# Patient Record
Sex: Female | Born: 1955 | Race: White | Hispanic: No | Marital: Single | Smoking: Never smoker
Health system: Southern US, Community
[De-identification: ages and names within clinical notes are randomized; demographics above are authoritative.]

## PROBLEM LIST (undated history)

## (undated) DIAGNOSIS — K589 Irritable bowel syndrome without diarrhea: Secondary | ICD-10-CM

## (undated) DIAGNOSIS — H3530 Unspecified macular degeneration: Secondary | ICD-10-CM

## (undated) DIAGNOSIS — Z9109 Other allergy status, other than to drugs and biological substances: Secondary | ICD-10-CM

## (undated) DIAGNOSIS — M1990 Unspecified osteoarthritis, unspecified site: Secondary | ICD-10-CM

## (undated) DIAGNOSIS — H409 Unspecified glaucoma: Secondary | ICD-10-CM

## (undated) DIAGNOSIS — G4700 Insomnia, unspecified: Secondary | ICD-10-CM

## (undated) DIAGNOSIS — E785 Hyperlipidemia, unspecified: Secondary | ICD-10-CM

## (undated) DIAGNOSIS — L309 Dermatitis, unspecified: Secondary | ICD-10-CM

## (undated) DIAGNOSIS — F32A Depression, unspecified: Secondary | ICD-10-CM

## (undated) DIAGNOSIS — Z8619 Personal history of other infectious and parasitic diseases: Secondary | ICD-10-CM

## (undated) DIAGNOSIS — E663 Overweight: Secondary | ICD-10-CM

## (undated) HISTORY — DX: Unspecified macular degeneration: H35.30

## (undated) HISTORY — DX: Unspecified osteoarthritis, unspecified site: M19.90

## (undated) HISTORY — DX: Other allergy status, other than to drugs and biological substances: Z91.09

## (undated) HISTORY — DX: Dermatitis, unspecified: L30.9

## (undated) HISTORY — DX: Overweight: E66.3

## (undated) HISTORY — DX: Unspecified glaucoma: H40.9

## (undated) HISTORY — DX: Personal history of other infectious and parasitic diseases: Z86.19

## (undated) HISTORY — DX: Hyperlipidemia, unspecified: E78.5

## (undated) HISTORY — PX: INCISION AND DRAINAGE BREAST ABSCESS: SUR672

## (undated) HISTORY — DX: Insomnia, unspecified: G47.00

## (undated) HISTORY — DX: Depression, unspecified: F32.A

## (undated) HISTORY — PX: DILATION AND CURETTAGE OF UTERUS: SHX78

## (undated) HISTORY — DX: Irritable bowel syndrome, unspecified: K58.9

## (undated) HISTORY — PX: SKIN BIOPSY: SHX1

---

## 1987-09-22 HISTORY — PX: BREAST EXCISIONAL BIOPSY: SUR124

## 2009-09-09 LAB — HM COLONOSCOPY

## 2020-08-12 LAB — HM MAMMOGRAPHY

## 2021-11-04 ENCOUNTER — Encounter: Payer: Self-pay | Admitting: Internal Medicine

## 2021-11-04 ENCOUNTER — Ambulatory Visit
Admission: RE | Admit: 2021-11-04 | Discharge: 2021-11-04 | Disposition: A | Discharge status: Home / Self Care | Payer: Medicare Other | Attending: Internal Medicine | Admitting: Internal Medicine

## 2021-11-04 ENCOUNTER — Other Ambulatory Visit: Payer: Self-pay

## 2021-11-04 ENCOUNTER — Other Ambulatory Visit: Payer: Self-pay | Admitting: Gastroenterology

## 2021-11-04 ENCOUNTER — Ambulatory Visit (INDEPENDENT_AMBULATORY_CARE_PROVIDER_SITE_OTHER): Payer: Medicare Other | Admitting: Internal Medicine

## 2021-11-04 ENCOUNTER — Ambulatory Visit
Admission: RE | Admit: 2021-11-04 | Discharge: 2021-11-04 | Disposition: A | Discharge status: Home / Self Care | Payer: Medicare Other | Source: Ambulatory Visit | Attending: Internal Medicine | Admitting: Internal Medicine

## 2021-11-04 VITALS — BP 120/78 | HR 76 | Temp 98.1°F | Resp 16 | Ht 60.25 in | Wt 142.6 lb

## 2021-11-04 DIAGNOSIS — E782 Mixed hyperlipidemia: Secondary | ICD-10-CM

## 2021-11-04 DIAGNOSIS — E2839 Other primary ovarian failure: Secondary | ICD-10-CM

## 2021-11-04 DIAGNOSIS — H353130 Nonexudative age-related macular degeneration, bilateral, stage unspecified: Secondary | ICD-10-CM

## 2021-11-04 DIAGNOSIS — M79672 Pain in left foot: Secondary | ICD-10-CM

## 2021-11-04 DIAGNOSIS — F3340 Major depressive disorder, recurrent, in remission, unspecified: Secondary | ICD-10-CM

## 2021-11-04 DIAGNOSIS — Z8619 Personal history of other infectious and parasitic diseases: Secondary | ICD-10-CM | POA: Insufficient documentation

## 2021-11-04 DIAGNOSIS — L309 Dermatitis, unspecified: Secondary | ICD-10-CM | POA: Insufficient documentation

## 2021-11-04 DIAGNOSIS — F329 Major depressive disorder, single episode, unspecified: Secondary | ICD-10-CM | POA: Insufficient documentation

## 2021-11-04 DIAGNOSIS — Z1211 Encounter for screening for malignant neoplasm of colon: Secondary | ICD-10-CM

## 2021-11-04 DIAGNOSIS — Z13820 Encounter for screening for osteoporosis: Secondary | ICD-10-CM

## 2021-11-04 DIAGNOSIS — H409 Unspecified glaucoma: Secondary | ICD-10-CM | POA: Insufficient documentation

## 2021-11-04 DIAGNOSIS — Z9109 Other allergy status, other than to drugs and biological substances: Secondary | ICD-10-CM

## 2021-11-04 DIAGNOSIS — H3530 Unspecified macular degeneration: Secondary | ICD-10-CM | POA: Insufficient documentation

## 2021-11-04 DIAGNOSIS — Z1231 Encounter for screening mammogram for malignant neoplasm of breast: Secondary | ICD-10-CM

## 2021-11-04 DIAGNOSIS — Z789 Other specified health status: Secondary | ICD-10-CM | POA: Diagnosis not present

## 2021-11-04 DIAGNOSIS — E781 Pure hyperglyceridemia: Secondary | ICD-10-CM

## 2021-11-04 DIAGNOSIS — M1990 Unspecified osteoarthritis, unspecified site: Secondary | ICD-10-CM | POA: Insufficient documentation

## 2021-11-04 DIAGNOSIS — G4700 Insomnia, unspecified: Secondary | ICD-10-CM

## 2021-11-04 DIAGNOSIS — E785 Hyperlipidemia, unspecified: Secondary | ICD-10-CM | POA: Insufficient documentation

## 2021-11-04 DIAGNOSIS — Z1159 Encounter for screening for other viral diseases: Secondary | ICD-10-CM

## 2021-11-04 DIAGNOSIS — Z800 Family history of malignant neoplasm of digestive organs: Secondary | ICD-10-CM

## 2021-11-04 MED ORDER — SUTAB 1479-225-188 MG PO TABS
12.0000 | ORAL_TABLET | Freq: Once | ORAL | 0 refills | Status: DC
Start: 2021-11-04 — End: 2021-11-04

## 2021-11-04 NOTE — Assessment & Plan Note (Signed)
History of statin intolerance - cause rhabdo in the past. Currently on fish oil, will obtain labs today.

## 2021-11-04 NOTE — Progress Notes (Signed)
New Patient Office Visit  Subjective:  Patient ID: Melissa Malone, female    DOB: 12/09/1955  Age: 66 y.o. MRN: [id number]  CC:  Chief Complaint  Patient presents with   Establish Care   Foot Pain    On top of left foot   skin changes    New skin spots    HPI Melissa Malone presents as a new patient. She recently moved here from [place] to be closer to family. She does have a complaint today - left foot pain.   FOOT PAIN Duration:  1.5 years Involved foot: left Mechanism of injury: unknown Location: top of foot Onset: gradual  Quality:  sharp and pressure-like Frequency: occasional Radiation: no Aggravating factors: walking on pavement, prolonged standing Alleviating factors: nothing  Status: fluctuating Weakness with weight bearing or walking: no Noticed that big toe is pushing into second digit, making her pain worse.  HLD: -Medications: Fish oil, statin intolerance -history of rhabdomyolysis  -Patient is compliant with above medications and reports no side effects.  -Last lipid panel: in 9/21 will repeat today  Macular Degeneration both wet and dry: -Right every 6 weeks and left every 10 weeks -Following with Ophthalmology at [facility]   MDD: -Mood status: stable -Current treatment: Lexapro 5 for the last 2-3 weeks, trying to wean off  -Satisfied with current treatment?: yes -Duration of current treatment : chronic -Side effects: no Medication compliance:  trying to taper down   Depression screen [facility] 2/9 11/04/2021  Decreased Interest 0  Down, Depressed, Hopeless 1  PHQ - 2 Score 1  Altered sleeping 2  Tired, decreased energy 1  Change in appetite 2  Feeling bad or failure about yourself  0  Trouble concentrating 0  Moving slowly or fidgety/restless 0  Suicidal thoughts 0  PHQ-9 Score 6  Difficult doing work/chores Somewhat difficult   Seasonal Allergies: -Currently on Zyrtec   Health Maintenance: -Blood work due  -Mammogram/DEXA  due -Colon ca screening due   Past Medical History:  Diagnosis Date   Arthritis    Depression    Eczema    Environmental allergies    Glaucoma    H/O cold sores    Hyperlipidemia    IBS (irritable bowel syndrome)    Insomnia    Macular degeneration    Over weight     Family History  Problem Relation Age of Onset   Arthritis Mother    Diabetes Mother    Heart disease Mother    COPD Mother    Dementia Mother    Macular degeneration Mother    Macular degeneration Father    Arthritis Father    Heart disease Father    Cancer Father        lung   COPD Father    Macular degeneration Sister    Diabetes Sister    Obesity Sister    Irregular heart beat Sister    Irregular heart beat Brother    Cancer Brother        COLON   Obesity Brother    Depression Daughter    Obesity Daughter     Social History   Socioeconomic History   Marital status: Single    Spouse name: Not on file   Number of children: Not on file   Years of education: Not on file   Highest education level: Not on file  Occupational History   Not on file  Tobacco Use   Smoking status: Never   Smokeless tobacco:  Never  Vaping Use   Vaping Use: Never used  Substance and Sexual Activity   Alcohol use: Yes    Comment: 1-2 PER MONTH   Drug use: Never   Sexual activity: Not Currently  Other Topics Concern   Not on file  Social History Narrative   Not on file   Social Determinants of Health   Financial Resource Strain: Not on file  Food Insecurity: Not on file  Transportation Needs: Not on file  Physical Activity: Not on file  Stress: Not on file  Social Connections: Not on file  Intimate Partner Violence: Not on file    ROS Review of Systems  Constitutional:  Negative for chills and fever.  Eyes:  Positive for visual disturbance.  Respiratory:  Negative for cough and shortness of breath.   Cardiovascular:  Negative for chest pain.  Gastrointestinal:  Negative for abdominal pain.   Neurological:  Negative for dizziness and headaches.   Objective:   Today's Vitals: BP 120/78    Pulse 76    Temp 98.1 F (36.7 C)    Resp 16    Ht 4' 2.25" (1.276 m)    Wt 142 lb 9.6 oz (64.7 kg)    SpO2 99%    BMI 39.71 kg/m   Physical Exam Constitutional:      Appearance: Normal appearance.  HENT:     Head: Normocephalic and atraumatic.     Mouth/Throat:     Mouth: Mucous membranes are moist.     Pharynx: Oropharynx is clear.  Eyes:     Conjunctiva/sclera: Conjunctivae normal.  Cardiovascular:     Rate and Rhythm: Normal rate and regular rhythm.  Pulmonary:     Effort: Pulmonary effort is normal.     Breath sounds: Normal breath sounds.  Musculoskeletal:        General: Tenderness present. No swelling. Normal range of motion.     Right lower leg: No edema.     Left lower leg: No edema.     Comments: First toe starting to push towards second digit, no other obvious deformities   Skin:    General: Skin is warm and dry.  Neurological:     General: No focal deficit present.     Mental Status: She is alert. Mental status is at baseline.  Psychiatric:        Mood and Affect: Mood normal.        Behavior: Behavior normal.    Assessment & Plan:   Problem List Items Addressed This Visit       Other   Hyperlipemia    History of statin intolerance - cause rhabdo in the past. Currently on fish oil, will obtain labs today.      Relevant Orders   Lipid Profile   Macular degeneration    Following with ophthalmology for injections.      Relevant Orders   CBC w/Diff/Platelet   COMPLETE METABOLIC PANEL WITH GFR   Environmental allergies    Stable, on Zrytec.      MDD (major depressive disorder)    Symptoms well controlled now that her sleeping is better and she is on a better schedule. Had been on Lexapro for years, currently on 5 mg and trying to taper down. Will discontinue.       Relevant Medications   escitalopram (LEXAPRO) 10 MG tablet   Insomnia    Well  controlled currently with Melatonin and Benadryl occasionally. Discussed avoiding Benadryl as she gets older, patient agrees.  Statin intolerance    Hx of rhabdomyolysis.       Other Visit Diagnoses     Left foot pain    -  Primary - x-ray today to rule out stress fracture, referral to Podiatry for splinting.    Relevant Orders   DG Foot Complete Left   Ambulatory referral to Podiatry   Screening for colon cancer       Relevant Orders   Ambulatory referral to Gastroenterology   Encounter for screening mammogram for malignant neoplasm of breast       Relevant Orders   MM 3D SCREEN BREAST BILATERAL   Screening for osteoporosis       Relevant Orders   DG Bone Density   Estrogen deficiency       Relevant Orders   DG Bone Density   Encounter for hepatitis C screening test for low risk patient       Relevant Orders   Hepatitis C Antibody       Outpatient Encounter Medications as of 11/04/2021  Medication Sig   acyclovir ointment (ZOVIRAX) 5 % Apply 1 application topically as needed.   CALCIUM 600 1500 (600 Ca) MG TABS tablet    cetirizine (ZYRTEC) 10 MG tablet    escitalopram (LEXAPRO) 10 MG tablet Take 5 mg by mouth daily. Whinning down   latanoprost (XALATAN) 0.005 % ophthalmic solution    magnesium 30 MG tablet Take 30 mg by mouth 2 (two) times daily.   Melatonin 3 MG CAPS as needed.   Multiple Vitamin (MULTIVITAMIN) tablet Take 1 tablet by mouth daily.   Omega-3 Fatty Acids (FISH OIL) 1000 MG CAPS Take by mouth.   pimecrolimus (ELIDEL) 1 % cream Apply topically as needed.   Probiotic Product (PROBIOTIC-10 PO) Take by mouth every other day.   No facility-administered encounter medications on file as of 11/04/2021.    Follow-up: Return for CPE at her convenience.   Teodora Medici, DO

## 2021-11-04 NOTE — Assessment & Plan Note (Signed)
Well controlled currently with Melatonin and Benadryl occasionally. Discussed avoiding Benadryl as she gets older, patient agrees.

## 2021-11-04 NOTE — Assessment & Plan Note (Signed)
Symptoms well controlled now that her sleeping is better and she is on a better schedule. Had been on Lexapro for years, currently on 5 mg and trying to taper down. Will discontinue.

## 2021-11-04 NOTE — Assessment & Plan Note (Signed)
Following with ophthalmology for injections.

## 2021-11-04 NOTE — Assessment & Plan Note (Signed)
Hx of rhabdomyolysis.

## 2021-11-04 NOTE — Patient Instructions (Addendum)
It was great seeing you today!  Plan discussed at today's visit: -Blood work ordered today, results will be uploaded to [place].  -Mammogram and DEXA scan ordered -Referral to GI for colon cancer screening  -Referral to Podiatry as well  -X-ray ordered today   Follow up in: for CPE  Take care and let us know if you have any questions or concerns prior to your next visit.  Dr. Rosana Berger

## 2021-11-04 NOTE — Progress Notes (Signed)
Gastroenterology Pre-Procedure Review  Request Date: 12/19/2021 Requesting Physician: Dr. Marius Ditch  PATIENT REVIEW QUESTIONS: The patient responded to the following health history questions as indicated:    1. Are you having any GI issues? no 2. Do you have a personal history of Polyps? no 3. Do you have a family history of Colon Cancer or Polyps? yes (Parents- polyps; brother- colon cancer) 4. Diabetes Mellitus? no 5. Joint replacements in the past 12 months?no 6. Major health problems in the past 3 months?yes (macular degeneration of eyes) 7. Any artificial heart valves, MVP, or defibrillator?no    MEDICATIONS & ALLERGIES:    Patient reports the following regarding taking any anticoagulation/antiplatelet therapy:   Plavix, Coumadin, Eliquis, Xarelto, Lovenox, Pradaxa, Brilinta, or Effient? no Aspirin? no  Patient confirms/reports the following medications:  Current Outpatient Medications  Medication Sig Dispense Refill   acyclovir ointment (ZOVIRAX) 5 % Apply 1 application topically as needed.     CALCIUM 600 1500 (600 Ca) MG TABS tablet      cetirizine (ZYRTEC) 10 MG tablet      escitalopram (LEXAPRO) 10 MG tablet Take 5 mg by mouth daily. Whinning down     latanoprost (XALATAN) 0.005 % ophthalmic solution      magnesium 30 MG tablet Take 30 mg by mouth 2 (two) times daily.     Melatonin 3 MG CAPS as needed.     Multiple Vitamin (MULTIVITAMIN) tablet Take 1 tablet by mouth daily.     Omega-3 Fatty Acids (FISH OIL) 1000 MG CAPS Take by mouth.     pimecrolimus (ELIDEL) 1 % cream Apply topically as needed.     Probiotic Product (PROBIOTIC-10 PO) Take by mouth every other day.     No current facility-administered medications for this visit.    Patient confirms/reports the following allergies:  Allergies  Allergen Reactions   Bee Pollen    Codeine    Grass Pollen(K-O-R-T-Swt Vern)    Molds & Smuts    Nickel    Proparacaine    Statins     Rhabomyolitis    Sulfa Antibiotics      No orders of the defined types were placed in this encounter.   AUTHORIZATION INFORMATION Primary Insurance: 1D#: Group #:  Secondary Insurance: 1D#: Group #:  SCHEDULE INFORMATION: Date: 12/19/2021 Time: Location: [place]

## 2021-11-04 NOTE — Assessment & Plan Note (Signed)
Stable, on Zrytec.

## 2021-11-05 LAB — COMPLETE METABOLIC PANEL WITH GFR
AG Ratio: 1.7 (calc) (ref 1.0–2.5)
ALT: 38 U/L — ABNORMAL HIGH (ref 6–29)
AST: 27 U/L (ref 10–35)
Albumin: 4.5 g/dL (ref 3.6–5.1)
Alkaline phosphatase (APISO): 92 U/L (ref 37–153)
BUN: 15 mg/dL (ref 7–25)
CO2: 28 mmol/L (ref 20–32)
Calcium: 9.6 mg/dL (ref 8.6–10.4)
Chloride: 104 mmol/L (ref 98–110)
Creat: 0.66 mg/dL (ref 0.50–1.05)
Globulin: 2.7 g/dL (calc) (ref 1.9–3.7)
Glucose, Bld: 102 mg/dL — ABNORMAL HIGH (ref 65–99)
Potassium: 4.5 mmol/L (ref 3.5–5.3)
Sodium: 139 mmol/L (ref 135–146)
Total Bilirubin: 0.6 mg/dL (ref 0.2–1.2)
Total Protein: 7.2 g/dL (ref 6.1–8.1)
eGFR: 97 mL/min/{1.73_m2} (ref >=60)

## 2021-11-05 LAB — CBC WITH DIFFERENTIAL/PLATELET
Absolute Monocytes: 680 cells/uL (ref 200–950)
Basophils Absolute: 59 cells/uL (ref 0–200)
Basophils Relative: 0.9 %
Eosinophils Absolute: 251 cells/uL (ref 15–500)
Eosinophils Relative: 3.8 %
HCT: 39.7 % (ref 35.0–45.0)
Hemoglobin: 13.2 g/dL (ref 11.7–15.5)
Lymphs Abs: 3366 cells/uL (ref 850–3900)
MCH: 29.8 pg (ref 27.0–33.0)
MCHC: 33.2 g/dL (ref 32.0–36.0)
MCV: 89.6 fL (ref 80.0–100.0)
MPV: 10.5 fL (ref 7.5–12.5)
Monocytes Relative: 10.3 %
Neutro Abs: 2244 cells/uL (ref 1500–7800)
Neutrophils Relative %: 34 %
Platelets: 295 10*3/uL (ref 140–400)
RBC: 4.43 10*6/uL (ref 3.80–5.10)
RDW: 11.7 % (ref 11.0–15.0)
Total Lymphocyte: 51 %
WBC: 6.6 10*3/uL (ref 3.8–10.8)

## 2021-11-05 LAB — LIPID PANEL
Cholesterol: 279 mg/dL — ABNORMAL HIGH (ref <200)
HDL: 50 mg/dL (ref >=50)
LDL Cholesterol (Calc): 177 mg/dL (calc) — ABNORMAL HIGH
Non-HDL Cholesterol (Calc): 229 mg/dL (calc) — ABNORMAL HIGH (ref <130)
Total CHOL/HDL Ratio: 5.6 (calc) — ABNORMAL HIGH (ref <5.0)
Triglycerides: 289 mg/dL — ABNORMAL HIGH (ref <150)

## 2021-11-05 LAB — HEPATITIS C ANTIBODY
Hepatitis C Ab: NONREACTIVE
SIGNAL TO CUT-OFF: 0.07 (ref <1.00)

## 2021-12-12 ENCOUNTER — Ambulatory Visit (INDEPENDENT_AMBULATORY_CARE_PROVIDER_SITE_OTHER): Payer: Medicare Other | Admitting: Internal Medicine

## 2021-12-12 ENCOUNTER — Telehealth: Payer: Self-pay | Admitting: Gastroenterology

## 2021-12-12 ENCOUNTER — Encounter: Payer: Self-pay | Admitting: Internal Medicine

## 2021-12-12 VITALS — BP 116/60 | HR 82 | Temp 98.1°F | Resp 16 | Ht 60.25 in | Wt 147.0 lb

## 2021-12-12 DIAGNOSIS — E782 Mixed hyperlipidemia: Secondary | ICD-10-CM

## 2021-12-12 DIAGNOSIS — Z0000 Encounter for general adult medical examination without abnormal findings: Secondary | ICD-10-CM | POA: Diagnosis not present

## 2021-12-12 MED ORDER — ICOSAPENT ETHYL 1 G PO CAPS
2.0000 g | ORAL_CAPSULE | Freq: Two times a day (BID) | ORAL | 3 refills | Status: DC
Start: 2021-12-12 — End: 2023-01-15

## 2021-12-12 NOTE — Progress Notes (Signed)
Name: Melissa Malone   MRN: [id number]    DOB: May 22, 1956   Date:12/12/2021 ? ?     Progress Note ? ?Subjective ? ?Chief Complaint ? ?Chief Complaint  ?Patient presents with  ? Annual Exam  ? ? ?HPI ? ?Patient presents for annual CPE. ? ?Diet: eats healthy meals, eats a lot of protein and veggies but then snacks on sweets, does use butter as well.  ?Exercise: hikes once a week, walks twice a week at least, very active in general in the yard  ? ?The 10-year ASCVD risk score (Arnett DK, et al., 2019) is: 6.2% ?  Values used to calculate the score: ?    Age: 66 years ?    Sex: Female ?    Is Non-Hispanic African American: No ?    Diabetic: No ?    Tobacco smoker: No ?    Systolic Blood Pressure: 734 mmHg ?    Is BP treated: No ?    HDL Cholesterol: 50 mg/dL ?    Total Cholesterol: 279 mg/dL ? ? ? ?Depression: Phq 9 is  negative ? ?  12/12/2021  ?  2:03 PM 11/04/2021  ?  9:37 AM  ?Depression screen PHQ 2/9  ?Decreased Interest 0 0  ?Down, Depressed, Hopeless 0 1  ?PHQ - 2 Score 0 1  ?Altered sleeping 0 2  ?Tired, decreased energy 0 1  ?Change in appetite 0 2  ?Feeling bad or failure about yourself  0 0  ?Trouble concentrating 0 0  ?Moving slowly or fidgety/restless 0 0  ?Suicidal thoughts 0 0  ?PHQ-9 Score 0 6  ?Difficult doing work/chores Not difficult at all Somewhat difficult  ? ?Hypertension: ?BP Readings from Last 3 Encounters:  ?12/12/21 116/60  ?11/04/21 120/78  ? ?Obesity: ?Wt Readings from Last 3 Encounters:  ?12/12/21 147 lb (66.7 kg)  ?11/04/21 142 lb 9.6 oz (64.7 kg)  ? ?BMI Readings from Last 3 Encounters:  ?12/12/21 28.47 kg/m?  ?11/04/21 27.62 kg/m?  ?  ? ?Vaccines:  ? ?HPV: n/a ?Tdap: 2015 ?Shingrix: Had them in [place], will obtain records  ?Pneumonia: Had Pneumonia vaccine last year, not sure if 23 or 20  ?Flu: UTD ?COVID-19: UTD ? ? ?Hep C Screening: 2023 ?STD testing and prevention (HIV/chl/gon/syphilis): no concerns  ?Intimate partner violence: negative ?Menstrual History/LMP/Abnormal Bleeding:  ?Discussed  importance of follow up if any post-menopausal bleeding: yes ?Incontinence Symptoms: No. ? ?Breast cancer:  ?- Last Mammogram: ordered, not yet scheduled. Will call to schedule ?- BRCA gene screening: n/a ? ?Osteoporosis Prevention : Discussed high calcium and vitamin D supplementation, weight bearing exercises ?Bone density :ordered, not yet scheduled  ? ?Cervical cancer screening: last Pap 5 years ago, no abnormal Paps since age 66  ?Skin cancer: Discussed monitoring for atypical lesions  ?Colorectal cancer:   colonoscopy scheduled for next week Brother had colorectal cancer.  ?Lung cancer:  Low Dose CT Chest recommended if Age 4-80 years, 20 pack-year currently smoking OR have quit w/in 15years. Patient does not qualify.   ? ?Advanced Care Planning: A voluntary discussion about advance care planning including the explanation and discussion of advance directives.  Discussed health care proxy and Living will, and the patient was able to identify a health care proxy as daughter.  Patient does have a living will.  ? ?Lipids: ?Lab Results  ?Component Value Date  ? CHOL 279 (H) 11/04/2021  ? ?Lab Results  ?Component Value Date  ? HDL 50 11/04/2021  ? ?Lab Results  ?Component  Value Date  ? LDLCALC 177 (H) 11/04/2021  ? ?Lab Results  ?Component Value Date  ? TRIG 289 (H) 11/04/2021  ? ?Lab Results  ?Component Value Date  ? CHOLHDL 5.6 (H) 11/04/2021  ? ?No results found for: LDLDIRECT ? ?Glucose: ?Glucose, Bld  ?Date Value Ref Range Status  ?11/04/2021 102 (H) 65 - 99 mg/dL Final  ?  Comment:  ?  . ?           Fasting reference interval ?. ?For someone without known diabetes, a glucose value ?between 100 and 125 mg/dL is consistent with ?prediabetes and should be confirmed with a ?follow-up test. ?. ?  ? ? ?Patient Active Problem List  ? Diagnosis Date Noted  ? Hyperlipemia 11/04/2021  ? Macular degeneration 11/04/2021  ? Environmental allergies 11/04/2021  ? Arthritis 11/04/2021  ? Glaucoma 11/04/2021  ? MDD (major  depressive disorder) 11/04/2021  ? Eczema 11/04/2021  ? H/O cold sores 11/04/2021  ? Insomnia 11/04/2021  ? Statin intolerance 11/04/2021  ? ? ?Family History  ?Problem Relation Age of Onset  ? Arthritis Mother   ? Diabetes Mother   ? Heart disease Mother   ? COPD Mother   ? Dementia Mother   ? Macular degeneration Mother   ? Macular degeneration Father   ? Arthritis Father   ? Heart disease Father   ? Cancer Father   ?     lung  ? COPD Father   ? Macular degeneration Sister   ? Diabetes Sister   ? Obesity Sister   ? Irregular heart beat Sister   ? Irregular heart beat Brother   ? Cancer Brother   ?     COLON  ? Obesity Brother   ? Depression Daughter   ? Obesity Daughter   ? ? ?Social History  ? ?Socioeconomic History  ? Marital status: Single  ?  Spouse name: Not on file  ? Number of children: Not on file  ? Years of education: Not on file  ? Highest education level: Not on file  ?Occupational History  ? Not on file  ?Tobacco Use  ? Smoking status: Never  ? Smokeless tobacco: Never  ?Vaping Use  ? Vaping Use: Never used  ?Substance and Sexual Activity  ? Alcohol use: Yes  ?  Comment: 1-2 PER MONTH  ? Drug use: Never  ? Sexual activity: Not Currently  ?Other Topics Concern  ? Not on file  ?Social History Narrative  ? Not on file  ? ?Social Determinants of Health  ? ?Financial Resource Strain: Not on file  ?Food Insecurity: Not on file  ?Transportation Needs: Not on file  ?Physical Activity: Not on file  ?Stress: Not on file  ?Social Connections: Not on file  ?Intimate Partner Violence: Not on file  ? ? ? ?Current Outpatient Medications:  ?  acyclovir ointment (ZOVIRAX) 5 %, Apply 1 application topically as needed., Disp: , Rfl:  ?  CALCIUM 600 1500 (600 Ca) MG TABS tablet, , Disp: , Rfl:  ?  cetirizine (ZYRTEC) 10 MG tablet, , Disp: , Rfl:  ?  escitalopram (LEXAPRO) 10 MG tablet, Take 5 mg by mouth daily. Whinning down, Disp: , Rfl:  ?  latanoprost (XALATAN) 0.005 % ophthalmic solution, , Disp: , Rfl:  ?  magnesium  30 MG tablet, Take 30 mg by mouth 2 (two) times daily., Disp: , Rfl:  ?  Melatonin 3 MG CAPS, as needed., Disp: , Rfl:  ?  Multiple Vitamin (MULTIVITAMIN)  tablet, Take 1 tablet by mouth daily., Disp: , Rfl:  ?  Omega-3 Fatty Acids (FISH OIL) 1000 MG CAPS, Take by mouth., Disp: , Rfl:  ?  pimecrolimus (ELIDEL) 1 % cream, Apply topically as needed., Disp: , Rfl:  ?  Probiotic Product (PROBIOTIC-10 PO), Take by mouth every other day., Disp: , Rfl:  ? ?Allergies  ?Allergen Reactions  ? Bee Pollen   ? Codeine   ? Grass Pollen(K-O-R-T-Swt Vern)   ? Latex   ? Molds & Smuts   ? Nickel   ? Proparacaine   ? Statins   ?  Rhabomyolitis ?  ? Sulfa Antibiotics   ? ? ? ?Review of Systems  ?Constitutional: Negative.   ?Respiratory: Negative.    ?Cardiovascular: Negative.   ?Gastrointestinal: Negative.   ?Genitourinary: Negative.   ? ? ? ?Objective ? ?Vitals:  ? 12/12/21 1358  ?BP: 116/60  ?Pulse: 82  ?Resp: 16  ?Temp: 98.1 ?F (36.7 ?C)  ?SpO2: 98%  ?Weight: 147 lb (66.7 kg)  ?Height: 5' 0.25" (1.53 m)  ? ? ?Body mass index is 28.47 kg/m?. ? ?Physical Exam ?Constitutional:   ?   Appearance: Normal appearance.  ?HENT:  ?   Head: Normocephalic and atraumatic.  ?Eyes:  ?   Conjunctiva/sclera: Conjunctivae normal.  ?Cardiovascular:  ?   Rate and Rhythm: Normal rate and regular rhythm.  ?Pulmonary:  ?   Effort: Pulmonary effort is normal.  ?   Breath sounds: Normal breath sounds.  ?Musculoskeletal:  ?   Right lower leg: No edema.  ?   Left lower leg: No edema.  ?Skin: ?   General: Skin is warm and dry.  ?Neurological:  ?   General: No focal deficit present.  ?   Mental Status: She is alert. Mental status is at baseline.  ?Psychiatric:     ?   Mood and Affect: Mood normal.     ?   Behavior: Behavior normal.  ? ? ?Recent Results (from the past 2160 hour(s))  ?CBC w/Diff/Platelet     Status: None  ? Collection Time: 11/04/21 10:26 AM  ?Result Value Ref Range  ? WBC 6.6 3.8 - 10.8 Thousand/uL  ? RBC 4.43 3.80 - 5.10 Million/uL  ? Hemoglobin  13.2 11.7 - 15.5 g/dL  ? HCT 39.7 35.0 - 45.0 %  ? MCV 89.6 80.0 - 100.0 fL  ? MCH 29.8 27.0 - 33.0 pg  ? MCHC 33.2 32.0 - 36.0 g/dL  ? RDW 11.7 11.0 - 15.0 %  ? Platelets 295 140 - 400 Thousand/uL  ? MPV

## 2021-12-12 NOTE — Telephone Encounter (Signed)
PT has questions about colon  prep ?

## 2021-12-12 NOTE — Patient Instructions (Signed)
Health Maintenance, Female ?Adopting a healthy lifestyle and getting preventive care are important in promoting health and wellness. Ask your health care provider about: ?The right schedule for you to have regular tests and exams. ?Things you can do on your own to prevent diseases and keep yourself healthy. ?What should I know about diet, weight, and exercise? ?Eat a healthy diet ? ?Eat a diet that includes plenty of vegetables, fruits, low-fat dairy products, and lean protein. ?Do not eat a lot of foods that are high in solid fats, added sugars, or sodium. ?Maintain a healthy weight ?Body mass index (BMI) is used to identify weight problems. It estimates body fat based on height and weight. Your health care provider can help determine your BMI and help you achieve or maintain a healthy weight. ?Get regular exercise ?Get regular exercise. This is one of the most important things you can do for your health. Most adults should: ?Exercise for at least 150 minutes each week. The exercise should increase your heart rate and make you sweat (moderate-intensity exercise). ?Do strengthening exercises at least twice a week. This is in addition to the moderate-intensity exercise. ?Spend less time sitting. Even light physical activity can be beneficial. ?Watch cholesterol and blood lipids ?Have your blood tested for lipids and cholesterol at 66 years of age, then have this test every 5 years. ?Have your cholesterol levels checked more often if: ?Your lipid or cholesterol levels are high. ?You are older than 66 years of age. ?You are at high risk for heart disease. ?What should I know about cancer screening? ?Depending on your health history and family history, you may need to have cancer screening at various ages. This may include screening for: ?Breast cancer. ?Cervical cancer. ?Colorectal cancer. ?Skin cancer. ?Lung cancer. ?What should I know about heart disease, diabetes, and high blood pressure? ?Blood pressure and heart  disease ?High blood pressure causes heart disease and increases the risk of stroke. This is more likely to develop in people who have high blood pressure readings or are overweight. ?Have your blood pressure checked: ?Every 3-5 years if you are 19-36 years of age. ?Every year if you are 48 years old or older. ?Diabetes ?Have regular diabetes screenings. This checks your fasting blood sugar level. Have the screening done: ?Once every three years after age 18 if you are at a normal weight and have a low risk for diabetes. ?More often and at a younger age if you are overweight or have a high risk for diabetes. ?What should I know about preventing infection? ?Hepatitis B ?If you have a higher risk for hepatitis B, you should be screened for this virus. Talk with your health care provider to find out if you are at risk for hepatitis B infection. ?Hepatitis C ?Testing is recommended for: ?Everyone born from [birth year] through 1965. ?Anyone with known risk factors for hepatitis C. ?Sexually transmitted infections (STIs) ?Get screened for STIs, including gonorrhea and chlamydia, if: ?You are sexually active and are younger than 66 years of age. ?You are older than 66 years of age and your health care provider tells you that you are at risk for this type of infection. ?Your sexual activity has changed since you were last screened, and you are at increased risk for chlamydia or gonorrhea. Ask your health care provider if you are at risk. ?Ask your health care provider about whether you are at high risk for HIV. Your health care provider may recommend a prescription medicine to help prevent HIV  infection. If you choose to take medicine to prevent HIV, you should first get tested for HIV. You should then be tested every 3 months for as long as you are taking the medicine. ?Pregnancy ?If you are about to stop having your period (premenopausal) and you may become pregnant, seek counseling before you get pregnant. ?Take 400 to 800  micrograms (mcg) of folic acid every day if you become pregnant. ?Ask for birth control (contraception) if you want to prevent pregnancy. ?Osteoporosis and menopause ?Osteoporosis is a disease in which the bones lose minerals and strength with aging. This can result in bone fractures. If you are 70 years old or older, or if you are at risk for osteoporosis and fractures, ask your health care provider if you should: ?Be screened for bone loss. ?Take a calcium or vitamin D supplement to lower your risk of fractures. ?Be given hormone replacement therapy (HRT) to treat symptoms of menopause. ?Follow these instructions at home: ?Alcohol use ?Do not drink alcohol if: ?Your health care provider tells you not to drink. ?You are pregnant, may be pregnant, or are planning to become pregnant. ?If you drink alcohol: ?Limit how much you have to: ?0-1 drink a day. ?Know how much alcohol is in your drink. In the [place] one drink equals one 12 oz bottle of beer (355 mL), one 5 oz glass of wine (148 mL), or one 1? oz glass of hard liquor (44 mL). ?Lifestyle ?Do not use any products that contain nicotine or tobacco. These products include cigarettes, chewing tobacco, and vaping devices, such as e-cigarettes. If you need help quitting, ask your health care provider. ?Do not use street drugs. ?Do not share needles. ?Ask your health care provider for help if you need support or information about quitting drugs. ?General instructions ?Schedule regular health, dental, and eye exams. ?Stay current with your vaccines. ?Tell your health care provider if: ?You often feel depressed. ?You have ever been abused or do not feel safe at home. ?Summary ?Adopting a healthy lifestyle and getting preventive care are important in promoting health and wellness. ?Follow your health care provider's instructions about healthy diet, exercising, and getting tested or screened for diseases. ?Follow your health care provider's instructions on monitoring your  cholesterol and blood pressure. ?This information is not intended to replace advice given to you by your health care provider. Make sure you discuss any questions you have with your health care provider. ?Document Revised: 01/27/2021 Document Reviewed: 01/27/2021 ?Elsevier Patient Education ? 2022 [place]. ? ?   Why follow it? Research shows? ?Those who follow the Mediterranean diet have a reduced risk of heart disease  ?The diet is associated with a reduced incidence of Parkinson's and Alzheimer's diseases ?People following the diet may have longer life expectancies and lower rates of chronic diseases  ?The Dietary Guidelines for Americans recommends the Mediterranean diet as an eating plan to promote health and prevent disease ? ?What Is the Mediterranean Diet?  ?Healthy eating plan based on typical foods and recipes of Mediterranean-style cooking ?The diet is primarily a plant based diet; these foods should make up a majority of meals  ? ?Starches - Plant based foods should make up a majority of meals ?- They are an important sources of vitamins, minerals, energy, antioxidants, and fiber ?- Choose whole grains, foods high in fiber and minimally processed items  ?- Typical grain sources include wheat, oats, barley, corn, brown rice, bulgar, farro, millet, polenta, couscous  ?- Various types of beans  include chickpeas, lentils, fava beans, black beans, white beans   ?Fruits  Veggies - Large quantities of antioxidant rich fruits & veggies; 6 or more servings  ?- Vegetables can be eaten raw or lightly drizzled with oil and cooked  ?- Vegetables common to the traditional Mediterranean Diet include: artichokes, arugula, beets, broccoli, brussel sprouts, cabbage, carrots, celery, collard greens, cucumbers, eggplant, kale, leeks, lemons, lettuce, mushrooms, okra, onions, peas, peppers, potatoes, pumpkin, radishes, rutabaga, shallots, spinach, sweet potatoes, turnips, zucchini ?- Fruits common to the Mediterranean  Diet include: apples, apricots, avocados, cherries, clementines, dates, figs, grapefruits, grapes, melons, nectarines, oranges, peaches, pears, pomegranates, strawberries, tangerines  ?Fats - Replace butter and

## 2021-12-15 ENCOUNTER — Telehealth: Payer: Self-pay

## 2021-12-15 NOTE — Telephone Encounter (Signed)
Called patient back to answer any questions she may have about her prep no answer left voicemail for a call back ?

## 2021-12-17 ENCOUNTER — Telehealth: Payer: Self-pay

## 2021-12-17 NOTE — Telephone Encounter (Signed)
Patient called and wanted instructions on how to take prep I helped her understand how she is good now ?

## 2021-12-18 NOTE — Addendum Note (Signed)
Addended by: Teodora Medici on: 12/18/2021 02:26 PM ? ? Modules accepted: Level of Service ? ?

## 2021-12-19 ENCOUNTER — Encounter: Payer: Self-pay | Admitting: Gastroenterology

## 2021-12-19 ENCOUNTER — Other Ambulatory Visit: Payer: Self-pay

## 2021-12-19 ENCOUNTER — Ambulatory Visit: Payer: Medicare Other | Admitting: Anesthesiology

## 2021-12-19 ENCOUNTER — Ambulatory Visit
Admission: RE | Admit: 2021-12-19 | Discharge: 2021-12-19 | Disposition: A | Discharge status: Home / Self Care | Payer: Medicare Other | Attending: Gastroenterology | Admitting: Gastroenterology

## 2021-12-19 ENCOUNTER — Encounter
Admission: RE | Disposition: A | Discharge status: Home / Self Care | Payer: Self-pay | Source: Home / Self Care | Attending: Gastroenterology

## 2021-12-19 DIAGNOSIS — K589 Irritable bowel syndrome without diarrhea: Secondary | ICD-10-CM | POA: Insufficient documentation

## 2021-12-19 DIAGNOSIS — Z800 Family history of malignant neoplasm of digestive organs: Secondary | ICD-10-CM | POA: Diagnosis not present

## 2021-12-19 DIAGNOSIS — G4700 Insomnia, unspecified: Secondary | ICD-10-CM | POA: Diagnosis not present

## 2021-12-19 DIAGNOSIS — D123 Benign neoplasm of transverse colon: Secondary | ICD-10-CM | POA: Diagnosis not present

## 2021-12-19 DIAGNOSIS — Z1211 Encounter for screening for malignant neoplasm of colon: Secondary | ICD-10-CM | POA: Insufficient documentation

## 2021-12-19 DIAGNOSIS — K635 Polyp of colon: Secondary | ICD-10-CM

## 2021-12-19 DIAGNOSIS — F32A Depression, unspecified: Secondary | ICD-10-CM | POA: Diagnosis not present

## 2021-12-19 DIAGNOSIS — E785 Hyperlipidemia, unspecified: Secondary | ICD-10-CM | POA: Insufficient documentation

## 2021-12-19 HISTORY — PX: COLONOSCOPY WITH PROPOFOL: SHX5780

## 2021-12-19 SURGERY — COLONOSCOPY WITH PROPOFOL
Anesthesia: General

## 2021-12-19 MED ORDER — SODIUM CHLORIDE 0.9 % IV SOLN: INTRAVENOUS | Status: DC

## 2021-12-19 MED ORDER — PROPOFOL 10 MG/ML IV BOLUS
INTRAVENOUS | Status: DC | PRN
Start: 2021-12-19 — End: 2021-12-19
  Administered 2021-12-19: 20 mg via INTRAVENOUS
  Administered 2021-12-19: 10 mg via INTRAVENOUS
  Administered 2021-12-19: 30 mg via INTRAVENOUS
  Administered 2021-12-19: 70 mg via INTRAVENOUS

## 2021-12-19 MED ORDER — SODIUM CHLORIDE (PF) 0.9 % IJ SOLN
INTRAMUSCULAR | Status: DC | PRN
  Administered 2021-12-19: 5 mL

## 2021-12-19 MED ORDER — PROPOFOL 500 MG/50ML IV EMUL
INTRAVENOUS | Status: DC | PRN
  Administered 2021-12-19: 140 ug/kg/min via INTRAVENOUS

## 2021-12-19 NOTE — Anesthesia Preprocedure Evaluation (Addendum)
Anesthesia Evaluation  ?Patient identified by MRN, date of birth, ID band ?Patient awake ? ? ? ?Reviewed: ?Allergy & Precautions, NPO status , Patient's Chart, lab work & pertinent test results ? ?Airway ?Mallampati: II ? ?TM Distance: >3 FB ?Neck ROM: full ? ? ? Dental ? ?(+) Missing ?  ?Pulmonary ?neg pulmonary ROS,  ?  ?Pulmonary exam normal ? ? ? ? ? ? ? Cardiovascular ?negative cardio ROS ?Normal cardiovascular exam ? ? ?  ?Neuro/Psych ?PSYCHIATRIC DISORDERS Depression negative neurological ROS ?   ? GI/Hepatic ?negative GI ROS, Neg liver ROS,   ?Endo/Other  ?negative endocrine ROS ? Renal/GU ?negative Renal ROS  ?negative genitourinary ?  ?Musculoskeletal ? ? Abdominal ?Normal abdominal exam  (+)   ?Peds ? 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 ? ? Reproductive/Obstetrics ?negative OB ROS ? ?  ? ? ? ? ? ? ? ? ? ? ? ? ? ?  ?  ? ? ? ? ? ? ? ?Anesthesia Physical ?Anesthesia Plan ? ?ASA: 2 ? ?Anesthesia Plan: General  ? ?Post-op Pain Management:   ? ?Induction:  ? ?PONV Risk Score and Plan: Propofol infusion and TIVA ? ?Airway Management Planned: Natural Airway ? ?Additional Equipment:  ? ?Intra-op Plan:  ? ?Post-operative Plan:  ? ?Informed Consent: I have reviewed the patients History and Physical, chart, labs and discussed the procedure including the risks, benefits and alternatives for the proposed anesthesia with the patient or authorized representative who has indicated his/her understanding and acceptance.  ? ? ? ?Dental advisory given ? ?Plan Discussed with: Anesthesiologist, CRNA and Surgeon ? ?Anesthesia Plan Comments:   ? ? ? ? ? ?Anesthesia  Quick Evaluation ? ?

## 2021-12-19 NOTE — Anesthesia Postprocedure Evaluation (Signed)
Anesthesia Post Note ? ?Patient: Melissa Malone ? ?Procedure(s) Performed: COLONOSCOPY WITH PROPOFOL ? ?Patient location during evaluation: Endoscopy ?Anesthesia Type: General ?Level of consciousness: awake and alert ?Pain management: pain level controlled ?Vital Signs Assessment: post-procedure vital signs reviewed and stable ?Respiratory status: spontaneous breathing, nonlabored ventilation and respiratory function stable ?Cardiovascular status: blood pressure returned to baseline and stable ?Postop Assessment: no apparent nausea or vomiting ?Anesthetic complications: no ? ? ?No notable events documented. ? ? ?Last Vitals:  ?Vitals:  ? 12/19/21 0943 12/19/21 1003  ?BP: (!) 105/55 117/66  ?Pulse:    ?Resp:    ?Temp: (!) 35.7 ?C   ?SpO2:    ?  ?Last Pain:  ?Vitals:  ? 12/19/21 1003  ?TempSrc:   ?PainSc: 0-No pain  ? ? ?  ?  ?  ?  ?  ?  ? ?[place] Ouch ? ? ? ? ?

## 2021-12-19 NOTE — Transfer of Care (Signed)
Immediate Anesthesia Transfer of Care Note ? ?Patient: Melissa Malone ? ?Procedure(s) Performed: COLONOSCOPY WITH PROPOFOL ? ?Patient Location: PACU ? ?Anesthesia Type:General ? ?Level of Consciousness: awake, alert  and oriented ? ?Airway & Oxygen Therapy: Patient Spontanous Breathing ? ?Post-op Assessment: Report given to RN and Post -op Vital signs reviewed and stable ? ?Post vital signs: Reviewed and stable ? ?Last Vitals:  ?Vitals Value Taken Time  ?BP 117/66 12/19/21 1004  ?Temp 35.7 ?C 12/19/21 0943  ?Pulse 62 12/19/21 1010  ?Resp 16 12/19/21 1010  ?SpO2 100 % 12/19/21 1010  ?Vitals shown include unvalidated device data. ? ?Last Pain:  ?Vitals:  ? 12/19/21 1003  ?TempSrc:   ?PainSc: 0-No pain  ?   ? ?  ? ?Complications: No notable events documented. ?

## 2021-12-19 NOTE — Op Note (Signed)
[facility] ?Gastroenterology ?Patient Name: Melissa Malone ?Procedure Date: 12/19/2021 8:34 AM ?MRN: [id number] ?Account #: [id number] ?Date of Birth: 1956/08/02 ?Admit Type: Outpatient ?Age: 66 ?Room: [facility] ENDO ROOM 4 ?Gender: Female ?Note Status: Finalized ?Instrument Name: Colonoscope [id number] ?Procedure:             Colonoscopy ?Indications:           Screening for colorectal malignant neoplasm, This is  ?                       the patient's first colonoscopy, Last colonoscopy 10  ?                       years ago ?Providers:             Lin Landsman MD, MD ?Medicines:             General Anesthesia ?Complications:         No immediate complications. Estimated blood loss: None. ?Procedure:             Pre-Anesthesia Assessment: ?                       - Prior to the procedure, a History and Physical was  ?                       performed, and patient medications and allergies were  ?                       reviewed. The patient is competent. The risks and  ?                       benefits of the procedure and the sedation options and  ?                       risks were discussed with the patient. All questions  ?                       were answered and informed consent was obtained.  ?                       Patient identification and proposed procedure were  ?                       verified by the physician, the nurse, the  ?                       anesthesiologist, the anesthetist and the technician  ?                       in the pre-procedure area in the procedure room in the  ?                       endoscopy suite. Mental Status Examination: alert and  ?                       oriented. Airway Examination: normal oropharyngeal  ?                       airway and neck mobility.  Respiratory Examination:  ?                       clear to auscultation. CV Examination: normal.  ?                       Prophylactic Antibiotics: The patient does not require  ?                       prophylactic  antibiotics. Prior Anticoagulants: The  ?                       patient has taken no previous anticoagulant or  ?                       antiplatelet agents. ASA Grade Assessment: II - A  ?                       patient with mild systemic disease. After reviewing  ?                       the risks and benefits, the patient was deemed in  ?                       satisfactory condition to undergo the procedure. The  ?                       anesthesia plan was to use general anesthesia.  ?                       Immediately prior to administration of medications,  ?                       the patient was re-assessed for adequacy to receive  ?                       sedatives. The heart rate, respiratory rate, oxygen  ?                       saturations, blood pressure, adequacy of pulmonary  ?                       ventilation, and response to care were monitored  ?                       throughout the procedure. The physical status of the  ?                       patient was re-assessed after the procedure. ?                       After obtaining informed consent, the colonoscope was  ?                       passed under direct vision. Throughout the procedure,  ?                       the patient's blood pressure, pulse, and oxygen  ?  saturations were monitored continuously. The  ?                       Colonoscope was introduced through the anus and  ?                       advanced to the the cecum, identified by appendiceal  ?                       orifice and ileocecal valve. The colonoscopy was  ?                       extremely difficult due to restricted mobility of the  ?                       colon. Successful completion of the procedure was  ?                       aided by withdrawing the scope and replacing with the  ?                       pediatric colonoscope and applying abdominal pressure.  ?                       The patient tolerated the procedure well. The quality  ?                        of the bowel preparation was adequate to identify  ?                       polyps 6 mm and larger in size. ?Findings: ?     The perianal and digital rectal examinations were normal. Pertinent  ?     negatives include normal sphincter tone and no palpable rectal lesions. ?     A 10 mm polyp was found in the transverse colon. The polyp was flat.  ?     Preparations were made for mucosal resection. NBI was done to mark the  ?     borders of the lesion. Saline was injected to raise the lesion. Snare  ?     mucosal resection was performed. Resection and retrieval were complete.  ?     To prevent bleeding after mucosal resection, two hemostatic clips were  ?     successfully placed. There was no bleeding during, or at the end, of the  ?     procedure. ?     The retroflexed view of the distal rectum and anal verge was normal and  ?     showed no anal or rectal abnormalities. ?Impression:            - One 10 mm polyp in the transverse colon, removed  ?                       with mucosal resection. Resected and retrieved. Clips  ?                       were placed. ?                       - The distal rectum  and anal verge are normal on  ?                       retroflexion view. ?                       - Mucosal resection was performed. Resection and  ?                       retrieval were complete. ?Recommendation:        - Discharge patient to home (with escort). ?                       - Resume previous diet today. ?                       - Continue present medications. ?                       - Await pathology results. ?                       - Repeat colonoscopy in 3 - 5 years for surveillance. ?Procedure Code(s):     --- Professional --- ?                       [phone], Colonoscopy, flexible; with endoscopic mucosal  ?                       resection ?Diagnosis Code(s):     --- Professional --- ?                       Z12.11, Encounter for screening for malignant neoplasm  ?                       of colon ?                        K63.5, Polyp of colon ?CPT copyright 2019 [employer]. All rights reserved. ?The codes documented in this report are preliminary and upon coder review may  ?be revised to meet current compliance requirements. ?Dr. Ulyess Mort ?Aphrodite Harpenau Raeanne Gathers MD, MD ?12/19/2021 9:42:23 AM ?This report has been signed electronically. ?Number of Addenda: 0 ?Note Initiated On: 12/19/2021 8:34 AM ?Scope Withdrawal Time: 0 hours 31 minutes 57 seconds  ?Total Procedure Duration: 0 hours 44 minutes 15 seconds  ?Estimated Blood Loss:  Estimated blood loss: none. ?     [facility] ?

## 2021-12-19 NOTE — H&P (Signed)
?Cephas Darby, MD ?[address]  ?[address]  ?[place], [place] [address]  ?Main: [phone]  ?Fax: [phone] ?Pager: [phone] ? ?Primary Care Physician:  Teodora Medici, DO ?Primary Gastroenterologist:  Dr. Cephas Darby ? ?Pre-Procedure History & Physical: ?HPI:  Melissa Malone is a 66 y.o. female is here for an colonoscopy. ?  ?Past Medical History:  ?Diagnosis Date  ? Arthritis   ? Depression   ? Eczema   ? Environmental allergies   ? Glaucoma   ? H/O cold sores   ? Hyperlipidemia   ? IBS (irritable bowel syndrome)   ? Insomnia   ? Macular degeneration   ? Over weight   ? ? ?Past Surgical History:  ?Procedure Laterality Date  ? DILATION AND CURETTAGE OF UTERUS    ? INCISION AND DRAINAGE BREAST ABSCESS    ? SKIN BIOPSY    ? ? ?Prior to Admission medications   ?Medication Sig Start Date End Date Taking? Authorizing Provider  ?CALCIUM 600 1500 (600 Ca) MG TABS tablet  06/09/21  Yes [provider]  ?cetirizine (ZYRTEC) 10 MG tablet  06/09/21  Yes [provider]  ?ibuprofen (ADVIL) 600 MG tablet Take 600 mg by mouth every 6 (six) hours as needed.   Yes [provider]  ?icosapent Ethyl (VASCEPA) 1 g capsule Take 2 capsules (2 g total) by mouth 2 (two) times daily. 12/12/21  Yes Teodora Medici, DO  ?magnesium 30 MG tablet Take 30 mg by mouth 2 (two) times daily.   Yes [provider]  ?Melatonin 3 MG CAPS as needed. 07/22/21  Yes [provider]  ?Multiple Vitamin (MULTIVITAMIN) tablet Take 1 tablet by mouth daily.   Yes [provider]  ?Probiotic Product (PROBIOTIC-10 PO) Take by mouth every other day.   Yes [provider]  ?acyclovir ointment (ZOVIRAX) 5 % Apply 1 application topically as needed.    [provider]  ?latanoprost (XALATAN) 0.005 % ophthalmic solution  10/22/21   [provider]  ?pimecrolimus (ELIDEL) 1 % cream Apply topically as needed.    [provider]  ? ? ?Allergies as of 11/04/2021  - Review Complete 11/04/2021  ?Allergen Reaction Noted  ? Bee pollen  11/04/2021  ? Codeine  11/04/2021  ? Grass pollen([id number]-o-[id number]-t-swt vern)  11/04/2021  ? Molds & smuts  11/04/2021  ? Nickel  11/04/2021  ? Proparacaine  11/04/2021  ? Statins  11/04/2021  ? Sulfa antibiotics  11/04/2021  ? ? ?Family History  ?Problem Relation Age of Onset  ? Arthritis Mother   ? Diabetes Mother   ? Heart disease Mother   ? COPD Mother   ? Dementia Mother   ? Macular degeneration Mother   ? Macular degeneration Father   ? Arthritis Father   ? Heart disease Father   ? Cancer Father   ?     lung  ? COPD Father   ? Macular degeneration Sister   ? Diabetes Sister   ? Obesity Sister   ? Irregular heart beat Sister   ? Irregular heart beat Brother   ? Cancer Brother   ?     COLON  ? Obesity Brother   ? Depression Daughter   ? Obesity Daughter   ? ? ?Social History  ? ?Socioeconomic History  ? Marital status: Single  ?  Spouse name: Not on file  ? Number of children: Not on file  ? Years of education: Not on file  ? Highest education level:  Not on file  ?Occupational History  ? Not on file  ?Tobacco Use  ? Smoking status: Never  ? Smokeless tobacco: Never  ?Vaping Use  ? Vaping Use: Never used  ?Substance and Sexual Activity  ? Alcohol use: Yes  ?  Comment: 1-2 PER MONTH  ? Drug use: Never  ? Sexual activity: Not Currently  ?Other Topics Concern  ? Not on file  ?Social History Narrative  ? Not on file  ? ?Social Determinants of Health  ? ?Financial Resource Strain: Low Risk   ? Difficulty of Paying Living Expenses: Not hard at all  ?Food Insecurity: No Food Insecurity  ? Worried About Charity fundraiser in the Last Year: Never true  ? Ran Out of Food in the Last Year: Never true  ?Transportation Needs: No Transportation Needs  ? Lack of Transportation (Medical): No  ? Lack of Transportation (Non-Medical): No  ?Physical Activity: Insufficiently Active  ? Days of Exercise per Week: 2 days  ? Minutes of Exercise per Session: 60 min  ?Stress: No  Stress Concern Present  ? Feeling of Stress : Only a little  ?Social Connections: Moderately Isolated  ? Frequency of Communication with Friends and Family: Twice a week  ? Frequency of Social Gatherings with Friends and Family: Three times a week  ? Attends Religious Services: 1 to 4 times per year  ? Active Member of Clubs or Organizations: No  ? Attends Archivist Meetings: Never  ? Marital Status: Never married  ?Intimate Partner Violence: Not At Risk  ? Fear of Current or Ex-Partner: No  ? Emotionally Abused: No  ? Physically Abused: No  ? Sexually Abused: No  ? ? ?Review of Systems: ?See HPI, otherwise negative ROS ? ?Physical Exam: ?BP 114/71   Pulse 61   Temp (!) 96.7 ?F (35.9 ?C) (Temporal)   Resp 16   Ht 5' 0.25" (1.53 m)   Wt 66.7 kg   SpO2 100%   BMI 28.48 kg/m?  ?General:   Alert,  pleasant and cooperative in NAD ?Head:  Normocephalic and atraumatic. ?Neck:  Supple; no masses or thyromegaly. ?Lungs:  Clear throughout to auscultation.    ?Heart:  Regular rate and rhythm. ?Abdomen:  Soft, nontender and nondistended. Normal bowel sounds, without guarding, and without rebound.   ?Neurologic:  Alert and  oriented x4;  grossly normal neurologically. ? ?Impression/Plan: ?Melissa Malone is here for an colonoscopy to be performed for colon cancer screening ? ?Risks, benefits, limitations, and alternatives regarding  colonoscopy have been reviewed with the patient.  Questions have been answered.  All parties agreeable. ? ? ?Sherri Sear, MD  12/19/2021, 8:17 AM ?

## 2021-12-22 ENCOUNTER — Encounter: Payer: Self-pay | Admitting: Gastroenterology

## 2021-12-22 LAB — SURGICAL PATHOLOGY

## 2021-12-29 ENCOUNTER — Encounter: Payer: Self-pay | Admitting: Gastroenterology

## 2022-01-26 ENCOUNTER — Ambulatory Visit
Admission: RE | Admit: 2022-01-26 | Discharge: 2022-01-26 | Disposition: A | Discharge status: Home / Self Care | Payer: Medicare Other | Source: Ambulatory Visit | Attending: Internal Medicine | Admitting: Internal Medicine

## 2022-01-26 DIAGNOSIS — Z1231 Encounter for screening mammogram for malignant neoplasm of breast: Secondary | ICD-10-CM | POA: Insufficient documentation

## 2022-01-26 DIAGNOSIS — E2839 Other primary ovarian failure: Secondary | ICD-10-CM | POA: Insufficient documentation

## 2022-01-26 DIAGNOSIS — Z13820 Encounter for screening for osteoporosis: Secondary | ICD-10-CM

## 2022-02-04 ENCOUNTER — Inpatient Hospital Stay
Admission: RE | Admit: 2022-02-04 | Discharge: 2022-02-04 | Disposition: A | Discharge status: Home / Self Care | Payer: Self-pay | Source: Ambulatory Visit | Attending: *Deleted | Admitting: *Deleted

## 2022-02-04 ENCOUNTER — Other Ambulatory Visit: Payer: Self-pay | Admitting: *Deleted

## 2022-02-04 DIAGNOSIS — Z1231 Encounter for screening mammogram for malignant neoplasm of breast: Secondary | ICD-10-CM

## 2022-06-16 ENCOUNTER — Ambulatory Visit: Payer: Medicare Other | Admitting: Internal Medicine

## 2022-11-12 NOTE — Progress Notes (Signed)
   Acute Office Visit  Subjective:     Patient ID: Melissa Malone, female    DOB: 03/24/1956, 67 y.o.   MRN: [id number]  Chief Complaint  Patient presents with   Arm Pain    Left shoulder and arm pain    HPI Patient is in today for left shoulder pain x 2 months. Pain getting worse. ROM appropriate except for reaching behind her back.   SHOULDER PAIN Duration:  2 months Involved shoulder: left Mechanism of injury: trauma, had been lifting an empty box in her attic, it fell back  Location: diffuse but posterior  Onset:sudden Severity: 5/10 goes up to 8/10 when hooking bra  Quality:  sharp and pressure-like Frequency: intermittent Radiation: yes, radiating into deltoid  Aggravating factors: movement and sleep, unclasping bra Alleviating factors: APAP and NSAIDs  Status: worse Treatments attempted: Acteminophen, Ibuprofen 600 mg BID PRN  Relief with NSAIDs?:  moderate Weakness: no Numbness: no Decreased grip strength: no Redness: no Swelling: no  Review of Systems  Constitutional:  Negative for chills and fever.  Musculoskeletal:  Positive for joint pain.  Skin: Negative.   Neurological:  Negative for tingling, sensory change and weakness.       Objective:    BP 128/78   Pulse 89   Temp 97.7 F (36.5 C)   Resp 16   Ht 5' 0.25" (1.53 m)   Wt 147 lb 4.8 oz (66.8 kg)   SpO2 98%   BMI 28.53 kg/m  BP Readings from Last 3 Encounters:  11/13/22 128/78  12/19/21 117/66  12/12/21 116/60   Wt Readings from Last 3 Encounters:  11/13/22 147 lb 4.8 oz (66.8 kg)  12/19/21 147 lb 0.8 oz (66.7 kg)  12/12/21 147 lb (66.7 kg)      Physical Exam Constitutional:      Appearance: Normal appearance.  HENT:     Head: Normocephalic and atraumatic.  Eyes:     Conjunctiva/sclera: Conjunctivae normal.  Cardiovascular:     Rate and Rhythm: Normal rate and regular rhythm.  Pulmonary:     Effort: Pulmonary effort is normal.     Breath sounds: Normal breath sounds.   Musculoskeletal:     Left shoulder: Tenderness and bony tenderness present. No swelling, deformity or crepitus. Decreased range of motion. Normal strength.     Comments: Decreased ROM with internal and external ROM, 5/5 BUE strength.   Skin:    General: Skin is warm and dry.  Neurological:     General: No focal deficit present.     Mental Status: She is alert. Mental status is at baseline.  Psychiatric:        Mood and Affect: Mood normal.        Behavior: Behavior normal.     No results found for any visits on 11/13/22.      Assessment & Plan:   1. Chronic left shoulder pain: Good strength, ROM only affected with internal and external movement. Pain directly on infraspinatus tendon, concern for partial rotator cuff tendon tear. Will obtain x-ray today, most likely will require MRI and PT. Discussed anti-inflammatories and pain relief, patient has issues with her stomach and can only take Ibuprofen, will continue 600 mg every 6-8 hours as needed. Also discussed topical pain relievers as well.   - DG Shoulder Left; Future  Return if symptoms worsen or fail to improve.  Teodora Medici, DO

## 2022-11-13 ENCOUNTER — Ambulatory Visit
Admission: RE | Admit: 2022-11-13 | Discharge: 2022-11-13 | Disposition: A | Discharge status: Home / Self Care | Payer: Medicare Other | Attending: Internal Medicine | Admitting: Internal Medicine

## 2022-11-13 ENCOUNTER — Ambulatory Visit (INDEPENDENT_AMBULATORY_CARE_PROVIDER_SITE_OTHER): Payer: Medicare Other | Admitting: Internal Medicine

## 2022-11-13 ENCOUNTER — Encounter: Payer: Self-pay | Admitting: Internal Medicine

## 2022-11-13 ENCOUNTER — Ambulatory Visit
Admission: RE | Admit: 2022-11-13 | Discharge: 2022-11-13 | Disposition: A | Discharge status: Home / Self Care | Payer: Medicare Other | Source: Ambulatory Visit | Attending: Internal Medicine | Admitting: Internal Medicine

## 2022-11-13 VITALS — BP 128/78 | HR 89 | Temp 97.7°F | Resp 16 | Ht 60.25 in | Wt 147.3 lb

## 2022-11-13 DIAGNOSIS — G8929 Other chronic pain: Secondary | ICD-10-CM | POA: Diagnosis present

## 2022-11-13 DIAGNOSIS — M25512 Pain in left shoulder: Secondary | ICD-10-CM | POA: Diagnosis not present

## 2022-11-13 NOTE — Patient Instructions (Addendum)
It was great seeing you today!  Plan discussed at today's visit: -X-ray today -Continue pain medications as you have been, can try Naproxen for decreased side effects but take all NSAID's with food.  -Recommend Voltaren which is an topical anti-inflammatory   Follow up in: as needed   Take care and let us know if you have any questions or concerns prior to your next visit.  Dr. Rosana Berger

## 2022-11-17 NOTE — Addendum Note (Signed)
Addended by: Teodora Medici on: 11/17/2022 07:57 AM   Modules accepted: Orders

## 2022-11-24 ENCOUNTER — Telehealth: Payer: Self-pay | Admitting: Internal Medicine

## 2022-11-24 NOTE — Telephone Encounter (Signed)
Contacted Melissa Malone to schedule their annual wellness visit. Patient declined to schedule AWV at this time. Will call back to schedule AWV-I with [facility].  [place] Direct Dial: [phone]

## 2022-11-25 ENCOUNTER — Ambulatory Visit
Admission: RE | Admit: 2022-11-25 | Discharge: 2022-11-25 | Disposition: A | Discharge status: Home / Self Care | Payer: Medicare Other | Source: Ambulatory Visit | Attending: Internal Medicine | Admitting: Internal Medicine

## 2022-11-25 DIAGNOSIS — G8929 Other chronic pain: Secondary | ICD-10-CM | POA: Insufficient documentation

## 2022-11-25 DIAGNOSIS — M25512 Pain in left shoulder: Secondary | ICD-10-CM | POA: Diagnosis present

## 2022-11-27 ENCOUNTER — Other Ambulatory Visit: Payer: Self-pay

## 2022-11-27 DIAGNOSIS — G8929 Other chronic pain: Secondary | ICD-10-CM

## 2022-12-22 ENCOUNTER — Telehealth: Payer: Self-pay | Admitting: Internal Medicine

## 2022-12-22 NOTE — Telephone Encounter (Signed)
Contacted Aidynn Shaddix to schedule their annual wellness visit. Appointment made for 12/25/2022.  [place] Direct Dial: [phone]

## 2022-12-25 ENCOUNTER — Ambulatory Visit: Payer: Medicare Other

## 2023-01-07 ENCOUNTER — Ambulatory Visit: Payer: Medicare Other | Attending: Internal Medicine

## 2023-01-07 DIAGNOSIS — M25612 Stiffness of left shoulder, not elsewhere classified: Secondary | ICD-10-CM | POA: Insufficient documentation

## 2023-01-07 DIAGNOSIS — G8929 Other chronic pain: Secondary | ICD-10-CM | POA: Diagnosis present

## 2023-01-07 DIAGNOSIS — M25512 Pain in left shoulder: Secondary | ICD-10-CM | POA: Diagnosis not present

## 2023-01-07 NOTE — Therapy (Signed)
[facility] [address], [place], [address] Phone: [phone]   Fax:  [phone]  Physical Therapy Evaluation  Patient Details  Name: Melissa Malone MRN: [id number] Date of Birth: 1955-12-08 Referring Provider (PT): Margarita Mail, DO   Encounter Date: 01/07/2023   PT End of Session - 01/07/23 1144     Visit Number 1    Number of Visits 17    Date for PT Re-Evaluation 03/05/23    PT Start Time 1146             Past Medical History:  Diagnosis Date   Arthritis    Depression    Eczema    Environmental allergies    Glaucoma    H/O cold sores    Hyperlipidemia    IBS (irritable bowel syndrome)    Insomnia    Macular degeneration    Over weight     Past Surgical History:  Procedure Laterality Date   BREAST EXCISIONAL BIOPSY Right 1989   neg   COLONOSCOPY WITH PROPOFOL N/A 12/19/2021   Procedure: COLONOSCOPY WITH PROPOFOL;  Surgeon: Toney Reil, MD;  Location: [facility] ENDOSCOPY;  Service: Gastroenterology;  Laterality: N/A;   DILATION AND CURETTAGE OF UTERUS     INCISION AND DRAINAGE BREAST ABSCESS     SKIN BIOPSY      There were no vitals filed for this visit.    Subjective Assessment - 01/07/23 1203     Subjective L superior shoulder pain. 0/10 at rest when arm is at her side, 4/10 currently when raising her L arm up; 8/10 at worst for the past 3 months (when donning and doffing her bra or reaching behind her.    Pertinent History Chronic L shoulder pain. Sudden onset. Pt was putting an empty box up to the attic, the box fell and pt over flexed (past end range flexion) her L arm and shoulder resulting in the injury December 2023. Pain has worsened since onset. Has not yet had PT for her L shoulder. Pt is R hand dominant.    Patient Stated Goals Be able to put her bra on and off without pain.    Currently in Pain? Yes    Pain Score 4     Pain Location Shoulder    Pain Orientation Left     Pain Descriptors / Indicators Pressure;Sharp    Pain Type Chronic pain    Pain Onset More than a month ago    Pain Frequency Occasional    Aggravating Factors  superior L shoulder pain: Raising her L arm up, reaching across (horizontal adduction), donning and doffing her bra/reaching behind her (bothers L infraspinatus area). reaching back to get her seatbelt. Sleeping on her L side.    Pain Relieving Factors resting position, heat, gentle movement.                [facility] PT Assessment - 01/07/23 1147       Assessment   Medical Diagnosis M25.512,G89.29 (ICD-10-CM) - Chronic left shoulder pain    Referring Provider (PT) Margarita Mail, DO    Onset Date/Surgical Date 11/27/22   Date PT referral signed.   Hand Dominance Right    Prior Therapy None for current condition      Precautions   Precaution Comments Latex allergies      Restrictions   Other Position/Activity Restrictions No kown restrictions      Balance Screen   Has the patient fallen in the  past 6 months Yes    How many times? 2   slipped down on wet grass while walking down a hill; Pt was also hiking with her dog and ended up side stepping to the L of the trail and fell.   Has the patient had a decrease in activity level because of a fear of falling?  No    Is the patient reluctant to leave their home because of a fear of falling?  No      Posture/Postural Control   Posture Comments forward neck, movemen preference around C5/6 area, L scapular protraction, R cervical lateral shift posture.      AROM   Left Shoulder Flexion 140 Degrees   142 AAROM, stiff end feel   Left Shoulder ABduction 150 Degrees   165 AAROM with pain, stiff end feel.   Left Shoulder Internal Rotation --   Functional IR: L thumb to L 1 spinous process.   Left Shoulder External Rotation --   Functional ER: L middle finger to R levator scapula   Cervical Flexion WFL    Cervical Extension WFL    Cervical - Right Side Bend WFL    Cervical -  Left Side Bend WFL    Cervical - Right Rotation WFL    Cervical - Left Rotation [facility]      Strength   Overall Strength Comments Lower trap: 4-/5 L with reproduction of pain.    Left Shoulder Flexion 5/5    Left Shoulder ABduction 5/5   with superior shoulder pain   Left Shoulder Internal Rotation 4+/5    Left Shoulder External Rotation 4/5      Palpation   Palpation comment TTP L AC joint      Special Tests   Other special tests (+) Leanord Asal with L posterior shoulder pain. L Empty can with slight pulling sensation                        Objective measurements completed on examination: See above findings.   Latex allergies Perfume No blood pressure problems per pt.   Decreased L shoulder pain with flexion with addition of L scapular retraction and posterior tipping.   Therapeutic exercise  Standing L shoulder IR 10x5 seconds for 3 sets  Function L shoulder IR: L thumb to T12 afterwards  Standing L shoulder extension with scapular retraction yellow band 10x5 seconds   Improved exercise technique, movement at target joints, use of target muscles after mod verbal, visual, tactile cues.     Response to treatment Fair tolerance to today's session    Clinical impression Pt is a 67 year old female who came to physical therapy secondary to L shoulder pain. She also presents with altered posture and glenohumeral mechanics, L scapular and rotator cuff weakness, positive special test suggesting L shoulder impingement, limited L shoulder AROM, TTP L acromioclavicular joint, and difficulty reaching up, across, and behind her as well as difficulty donning and doffing her bra secondary to pain. Pt will benefit from skilled physical therapy services to address the aforementioned deficits.           Access Code: [email or web address] URL: [email or web address] Date: 01/07/2023 Prepared by: Loralyn Freshwater  Exercises - Isometric Shoulder Internal Rotation  -  2 x daily - 7 x weekly - 3 sets - 10 reps - 5 seconds hold - Single Arm Shoulder Extension with Anchored Resistance  - 1 x daily - 7 x weekly - 3 sets -  10 reps - 5 seconds hold                 PT Short Term Goals - 01/07/23 1253       PT SHORT TERM GOAL #1   Title Pt will be independent with her initial HEP to decrease pain, improve strength, ability to reach and don and doff articles of clothing more comfortably.    Baseline Pt has started her initial HEP (01/07/2023)    Time 3    Period Weeks    Status New    Target Date 01/29/23               PT Long Term Goals - 01/07/23 1254       PT LONG TERM GOAL #1   Title Pt will have a decrease in L shoulder pain to 2/10 or less at worst to promote ability to reach as well as don and doff articles of clothing more comfortably.    Baseline 8/10 L shoulder pain at worst for the past 3 months (01/07/2023)    Time 8    Period Weeks    Status New    Target Date 03/05/23      PT LONG TERM GOAL #2   Title Pt will improve her L shoulder functional IR AROM to thumb to T7 spinous process to decrease difficulty donning and doffing her bra.    Baseline L shoulder functional IR AROM: Functional IR: L thumb to L 1 spinous process. (01/07/2023)    Time 8    Period Weeks    Status New    Target Date 03/05/23      PT LONG TERM GOAL #3   Title Pt will improve her L shoulder flexion AROM to 155 degrees or more to promote ability to reach more comfortably.    Baseline L shoulder flexion AROM 140 degrees AROM (01/07/2023)    Time 8    Period Weeks    Status New    Target Date 03/05/23      PT LONG TERM GOAL #4   Title Pt will improve her L shoulder FOTO score by at least 10 points as a demonstration of improved function.    Baseline L shoulder FOTO 63 (01/07/2023)    Time 8    Period Weeks    Status New    Target Date 03/05/23                    Plan - 01/07/23 1259     Clinical Impression Statement Pt is a 67 year  old female who came to physical therapy secondary to L shoulder pain. She also presents with altered posture and glenohumeral mechanics, L scapular and rotator cuff weakness, positive special test suggesting L shoulder impingement, limited L shoulder AROM, TTP L acromioclavicular joint, and difficulty reaching up, across, and behind her as well as difficulty donning and doffing her bra secondary to pain. Pt will benefit from skilled physical therapy services to address the aforementioned deficits.    Personal Factors and Comorbidities Comorbidity 3+;Age;Time since onset of injury/illness/exacerbation;Fitness    Comorbidities Arthritis, depression, insomnia    Examination-Activity Limitations Reach Overhead;Dressing;Hygiene/Grooming;Lift;Sleep    Stability/Clinical Decision Making Evolving/Moderate complexity   Pain is worsening per subjective reports   Clinical Decision Making Moderate    Rehab Potential Fair    PT Frequency 2x / week    PT Duration 8 weeks    PT Treatment/Interventions Therapeutic exercise;Therapeutic activities;Neuromuscular re-education;Patient/family education;Manual techniques;Dry needling;Electrical  Stimulation;Iontophoresis /ml Dexamethasone    PT Next Visit Plan scapular, rotator cuff strengthening. glenohumeral mechanics, manual techniques, modalities PRN    PT Home Exercise Plan Medbridge Access Code: [email or web address]    Consulted and Agree with Plan of Care Patient             Patient will benefit from skilled therapeutic intervention in order to improve the following deficits and impairments:  Pain, Postural dysfunction, Decreased strength, Improper body mechanics, Impaired UE functional use, Decreased range of motion  Visit Diagnosis: Chronic left shoulder pain - Plan: PT plan of care cert/re-cert  Stiffness of left shoulder, not elsewhere classified - Plan: PT plan of care cert/re-cert     Problem List Patient Active Problem List   Diagnosis Date Noted    Family history of colon cancer    Colon cancer screening    Polyp of transverse colon    Hyperlipemia 11/04/2021   Macular degeneration 11/04/2021   Environmental allergies 11/04/2021   Arthritis 11/04/2021   Glaucoma 11/04/2021   MDD (major depressive disorder) 11/04/2021   Eczema 11/04/2021   H/O cold sores 11/04/2021   Insomnia 11/04/2021   Statin intolerance 11/04/2021   Loralyn Freshwater PT, DPT  01/07/2023, 1:19 PM  [facility] [address] [place], [address] Phone: [phone]   Fax:  [phone]  Name: Melissa Malone MRN: [id number] Date of Birth: Jul 14, 1956

## 2023-01-08 ENCOUNTER — Ambulatory Visit (INDEPENDENT_AMBULATORY_CARE_PROVIDER_SITE_OTHER): Payer: Medicare Other

## 2023-01-08 VITALS — BP 110/60 | Ht 60.0 in | Wt 147.3 lb

## 2023-01-08 DIAGNOSIS — Z0000 Encounter for general adult medical examination without abnormal findings: Secondary | ICD-10-CM

## 2023-01-08 NOTE — Patient Instructions (Signed)
Melissa Malone , Thank you for taking time to come for your Medicare Wellness Visit. I appreciate your ongoing commitment to your health goals. Please review the following plan we discussed and let me know if I can assist you in the future.   These are the goals we discussed:  Goals   None     This is a list of the screening recommended for you and due dates:  Health Maintenance  Topic Date Due   Pneumonia Vaccine (1 of 1 - PCV) Never done   COVID-19 Vaccine (6 - 2023-24 season) 05/22/2022   Zoster (Shingles) Vaccine (1 of 2) 02/11/2023*   Flu Shot  04/22/2023   DTaP/Tdap/Td vaccine (2 - Td or Tdap) 09/22/2023   Medicare Annual Wellness Visit  01/08/2024   Mammogram  01/27/2024   Colon Cancer Screening  12/19/2024   DEXA scan (bone density measurement)  Completed   Hepatitis C Screening: USPSTF Recommendation to screen - Ages 12-79 yo.  Completed   HPV Vaccine  Aged Out  *Topic was postponed. The date shown is not the original due date.    Advanced directives: yes  Conditions/risks identified: none  Next appointment: Follow up in one year for your annual wellness visit 01/14/24   in person   Preventive Care 65 Years and Older, Female Preventive care refers to lifestyle choices and visits with your health care provider that can promote health and wellness. What does preventive care include? A yearly physical exam. This is also called an annual well check. Dental exams once or twice a year. Routine eye exams. Ask your health care provider how often you should have your eyes checked. Personal lifestyle choices, including: Daily care of your teeth and gums. Regular physical activity. Eating a healthy diet. Avoiding tobacco and drug use. Limiting alcohol use. Practicing safe sex. Taking low-dose aspirin every day. Taking vitamin and mineral supplements as recommended by your health care provider. What happens during an annual well check? The services and screenings done  by your health care provider during your annual well check will depend on your age, overall health, lifestyle risk factors, and family history of disease. Counseling  Your health care provider may ask you questions about your: Alcohol use. Tobacco use. Drug use. Emotional well-being. Home and relationship well-being. Sexual activity. Eating habits. History of falls. Memory and ability to understand (cognition). Work and work Astronomer. Reproductive health. Screening  You may have the following tests or measurements: Height, weight, and BMI. Blood pressure. Lipid and cholesterol levels. These may be checked every 5 years, or more frequently if you are over 21 years old. Skin check. Lung cancer screening. You may have this screening every year starting at age 40 if you have a 30-pack-year history of smoking and currently smoke or have quit within the past 15 years. Fecal occult blood test (FOBT) of the stool. You may have this test every year starting at age 54. Flexible sigmoidoscopy or colonoscopy. You may have a sigmoidoscopy every 5 years or a colonoscopy every 10 years starting at age 51. Hepatitis C blood test. Hepatitis B blood test. Sexually transmitted disease (STD) testing. Diabetes screening. This is done by checking your blood sugar (glucose) after you have not eaten for a while (fasting). You may have this done every 1-3 years. Bone density scan. This is done to screen for osteoporosis. You may have this done starting at age 69. Mammogram. This may be done every 1-2 years. Talk to your health care provider about  how often you should have regular mammograms. Talk with your health care provider about your test results, treatment options, and if necessary, the need for more tests. Vaccines  Your health care provider may recommend certain vaccines, such as: Influenza vaccine. This is recommended every year. Tetanus, diphtheria, and acellular pertussis (Tdap, Td) vaccine. You  may need a Td booster every 10 years. Zoster vaccine. You may need this after age 28. Pneumococcal 13-valent conjugate (PCV13) vaccine. One dose is recommended after age 52. Pneumococcal polysaccharide (PPSV23) vaccine. One dose is recommended after age 62. Talk to your health care provider about which screenings and vaccines you need and how often you need them. This information is not intended to replace advice given to you by your health care provider. Make sure you discuss any questions you have with your health care provider. Document Released: 10/04/2015 Document Revised: 05/27/2016 Document Reviewed: 07/09/2015 Elsevier Interactive Patient Education  2017 [employer].  Fall Prevention in the Home Falls can cause injuries. They can happen to people of all ages. There are many things you can do to make your home safe and to help prevent falls. What can I do on the outside of my home? Regularly fix the edges of walkways and driveways and fix any cracks. Remove anything that might make you trip as you walk through a door, such as a raised step or threshold. Trim any bushes or trees on the path to your home. Use bright outdoor lighting. Clear any walking paths of anything that might make someone trip, such as rocks or tools. Regularly check to see if handrails are loose or broken. Make sure that both sides of any steps have handrails. Any raised decks and porches should have guardrails on the edges. Have any leaves, snow, or ice cleared regularly. Use sand or salt on walking paths during winter. Clean up any spills in your garage right away. This includes oil or grease spills. What can I do in the bathroom? Use night lights. Install grab bars by the toilet and in the tub and shower. Do not use towel bars as grab bars. Use non-skid mats or decals in the tub or shower. If you need to sit down in the shower, use a plastic, non-slip stool. Keep the floor dry. Clean up any water that spills  on the floor as soon as it happens. Remove soap buildup in the tub or shower regularly. Attach bath mats securely with double-sided non-slip rug tape. Do not have throw rugs and other things on the floor that can make you trip. What can I do in the bedroom? Use night lights. Make sure that you have a light by your bed that is easy to reach. Do not use any sheets or blankets that are too big for your bed. They should not hang down onto the floor. Have a firm chair that has side arms. You can use this for support while you get dressed. Do not have throw rugs and other things on the floor that can make you trip. What can I do in the kitchen? Clean up any spills right away. Avoid walking on wet floors. Keep items that you use a lot in easy-to-reach places. If you need to reach something above you, use a strong step stool that has a grab bar. Keep electrical cords out of the way. Do not use floor polish or wax that makes floors slippery. If you must use wax, use non-skid floor wax. Do not have throw rugs and other  things on the floor that can make you trip. What can I do with my stairs? Do not leave any items on the stairs. Make sure that there are handrails on both sides of the stairs and use them. Fix handrails that are broken or loose. Make sure that handrails are as long as the stairways. Check any carpeting to make sure that it is firmly attached to the stairs. Fix any carpet that is loose or worn. Avoid having throw rugs at the top or bottom of the stairs. If you do have throw rugs, attach them to the floor with carpet tape. Make sure that you have a light switch at the top of the stairs and the bottom of the stairs. If you do not have them, ask someone to add them for you. What else can I do to help prevent falls? Wear shoes that: Do not have high heels. Have rubber bottoms. Are comfortable and fit you well. Are closed at the toe. Do not wear sandals. If you use a stepladder: Make  sure that it is fully opened. Do not climb a closed stepladder. Make sure that both sides of the stepladder are locked into place. Ask someone to hold it for you, if possible. Clearly mark and make sure that you can see: Any grab bars or handrails. First and last steps. Where the edge of each step is. Use tools that help you move around (mobility aids) if they are needed. These include: Canes. Walkers. Scooters. Crutches. Turn on the lights when you go into a dark area. Replace any light bulbs as soon as they burn out. Set up your furniture so you have a clear path. Avoid moving your furniture around. If any of your floors are uneven, fix them. If there are any pets around you, be aware of where they are. Review your medicines with your doctor. Some medicines can make you feel dizzy. This can increase your chance of falling. Ask your doctor what other things that you can do to help prevent falls. This information is not intended to replace advice given to you by your health care provider. Make sure you discuss any questions you have with your health care provider. Document Released: 07/04/2009 Document Revised: 02/13/2016 Document Reviewed: 10/12/2014 Elsevier Interactive Patient Education  2017 [employer].

## 2023-01-08 NOTE — Progress Notes (Addendum)
Subjective:   Melissa Malone is a 67 y.o. female who presents for Medicare Annual (Subsequent) preventive examination.  Review of Systems    Cardiac Risk Factors include: advanced age (>53men, >55 women);dyslipidemia;sedentary lifestyle    Objective:    Today's Vitals   01/08/23 1357 01/08/23 1358  Weight: 147 lb 4.8 oz (66.8 kg)   Height: 5' (1.524 m)   PainSc:  0-No pain   Body mass index is 28.77 kg/m.     01/08/2023    2:11 PM 01/07/2023   11:51 AM 12/19/2021    7:58 AM  Advanced Directives  Does Patient Have a Medical Advance Directive? Yes Yes Yes  Type of Special educational needs teacher of [employer];Living will Healthcare Power of [place];Living will  Does patient want to make changes to medical advance directive?  No - Patient declined   Copy of Healthcare Power of Attorney in Chart?   No - copy requested    Current Medications (verified) Outpatient Encounter Medications as of 01/08/2023  Medication Sig   acetaminophen (TYLENOL) 500 MG tablet Take 500 mg by mouth every 6 (six) hours as needed.   acyclovir ointment (ZOVIRAX) 5 % Apply 1 application topically as needed.   Ashwagandha 500 MG CAPS Take by mouth.   BRIMONIDINE TARTRATE-TIMOLOL OP Apply to eye.   CALCIUM 600 1500 (600 Ca) MG TABS tablet    carboxymethylcellulose (REFRESH PLUS) 0.5 % SOLN 1 drop 3 (three) times daily as needed.   cetirizine (ZYRTEC) 10 MG tablet    Cinnamon 500 MG capsule Take 500 mg by mouth daily.   diphenhydrAMINE (BENADRYL) 50 MG capsule Take 50 mg by mouth every 6 (six) hours as needed.   erythromycin ophthalmic ointment 1 Application at bedtime.   ibuprofen (ADVIL) 600 MG tablet Take 600 mg by mouth every 6 (six) hours as needed. (Patient not taking: Reported on 01/07/2023)   icosapent Ethyl (VASCEPA) 1 g capsule Take 2 capsules (2 g total) by mouth 2 (two) times daily. (Patient not taking: Reported on 01/07/2023)   latanoprost (XALATAN) 0.005 % ophthalmic solution    magnesium  30 MG tablet Take 30 mg by mouth 2 (two) times daily.   Melatonin 3 MG CAPS as needed.   Multiple Vitamin (MULTIVITAMIN) tablet Take 1 tablet by mouth daily.   NAPROXEN SODIUM PO Take by mouth.   Omega-3 Fatty Acids (FISH OIL) 1200 MG CAPS Take by mouth.   pimecrolimus (ELIDEL) 1 % cream Apply topically as needed.   Probiotic Product (PROBIOTIC-10 PO) Take by mouth every other day.   No facility-administered encounter medications on file as of 01/08/2023.    Allergies (verified) Bee pollen, Codeine, Grass pollen([id number]-swt vern), Latex, Molds & smuts, Nickel, Proparacaine, Statins, and Sulfa antibiotics   History: Past Medical History:  Diagnosis Date   Arthritis    Depression    Eczema    Environmental allergies    Glaucoma    H/O cold sores    Hyperlipidemia    IBS (irritable bowel syndrome)    Insomnia    Macular degeneration    Over weight    Past Surgical History:  Procedure Laterality Date   BREAST EXCISIONAL BIOPSY Right 1989   neg   COLONOSCOPY WITH PROPOFOL N/A 12/19/2021   Procedure: COLONOSCOPY WITH PROPOFOL;  Surgeon: Toney Reil, MD;  Location: [facility] ENDOSCOPY;  Service: Gastroenterology;  Laterality: N/A;   DILATION AND CURETTAGE OF UTERUS     INCISION AND DRAINAGE BREAST ABSCESS     SKIN  BIOPSY     Family History  Problem Relation Age of Onset   Arthritis Mother    Diabetes Mother    Heart disease Mother    COPD Mother    Dementia Mother    Macular degeneration Mother    Macular degeneration Father    Arthritis Father    Heart disease Father    Cancer Father        lung   COPD Father    Macular degeneration Sister    Diabetes Sister    Obesity Sister    Irregular heart beat Sister    Irregular heart beat Brother    Cancer Brother        COLON   Obesity Brother    Depression Daughter    Obesity Daughter    Social History   Socioeconomic History   Marital status: Single    Spouse name: Not on file   Number of children: Not on  file   Years of education: Not on file   Highest education level: Not on file  Occupational History   Not on file  Tobacco Use   Smoking status: Never   Smokeless tobacco: Never  Vaping Use   Vaping Use: Never used  Substance and Sexual Activity   Alcohol use: Yes    Comment: 1-2 PER MONTH   Drug use: Never   Sexual activity: Not Currently  Other Topics Concern   Not on file  Social History Narrative   Not on file   Social Determinants of Health   Financial Resource Strain: Low Risk  (01/08/2023)   Overall Financial Resource Strain (CARDIA)    Difficulty of Paying Living Expenses: Not hard at all  Food Insecurity: No Food Insecurity (01/08/2023)   Hunger Vital Sign    Worried About Running Out of Food in the Last Year: Never true    Ran Out of Food in the Last Year: Never true  Transportation Needs: No Transportation Needs (01/08/2023)   PRAPARE - Administrator, Civil Service (Medical): No    Lack of Transportation (Non-Medical): No  Physical Activity: Insufficiently Active (01/08/2023)   Exercise Vital Sign    Days of Exercise per Week: 1 day    Minutes of Exercise per Session: 60 min  Stress: No Stress Concern Present (01/08/2023)   [employer] of Occupational Health - Occupational Stress Questionnaire    Feeling of Stress : Only a little  Social Connections: Moderately Isolated (01/08/2023)   Social Connection and Isolation Panel [NHANES]    Frequency of Communication with Friends and Family: Twice a week    Frequency of Social Gatherings with Friends and Family: Three times a week    Attends Religious Services: More than 4 times per year    Active Member of Clubs or Organizations: No    Attends Banker Meetings: Never    Marital Status: Never married    Tobacco Counseling Counseling given: Not Answered   Clinical Intake:  Pre-visit preparation completed: Yes  Pain : No/denies pain Pain Score: 0-No pain     BMI - recorded:  28.77 Nutritional Status: BMI 25 -29 Overweight Nutritional Risks: None Diabetes: No  How often do you need to have someone help you when you read instructions, pamphlets, or other written materials from your doctor or pharmacy?: 1 - Never  Diabetic?no  Interpreter Needed?: No  Comments: lives alone Information entered by :: B.Sherline Eberwein,LPN   Activities of Daily Living    01/08/2023  2:11 PM 11/13/2022    2:36 PM  In your present state of health, do you have any difficulty performing the following activities:  Hearing? 0 0  Vision? 0 0  Difficulty concentrating or making decisions? 0 0  Walking or climbing stairs? 0 0  Dressing or bathing? 0 0  Doing errands, shopping? 0 0  Preparing Food and eating ? N   Using the Toilet? N   In the past six months, have you accidently leaked urine? N   Do you have problems with loss of bowel control? Y   Comment IBS   Managing your Medications? N   Managing your Finances? N   Housekeeping or managing your Housekeeping? N     Patient Care Team: Margarita Mail, DO as PCP - General (Internal Medicine)  Indicate any recent Medical Services you may have received from other than [facility] providers in the past year (date may be approximate).     Assessment:   This is a routine wellness examination for Melissa Malone.  Hearing/Vision screen Hearing Screening - Comments:: Adequate hearing Vision Screening - Comments:: Adequate vision w/glasses Dr Inez Pilgrim  Dietary issues and exercise activities discussed: Current Exercise Habits: The patient does not participate in regular exercise at present, Exercise limited by: None identified   Goals Addressed   None    Depression Screen    01/08/2023    2:04 PM 11/13/2022    2:36 PM 12/12/2021    2:03 PM 11/04/2021    9:37 AM  PHQ 2/9 Scores  PHQ - 2 Score 0 0 0 1  PHQ- 9 Score 0 0 0 6    Fall Risk    01/08/2023    2:01 PM 11/13/2022    2:36 PM 12/12/2021    2:03 PM 11/04/2021    9:37 AM   Fall Risk   Falls in the past year? 1 0 0 0  Number falls in past yr: 1 0 0 0  Injury with Fall? 0 0 0 0  Risk for fall due to : No Fall Risks     Follow up Education provided;Falls prevention discussed       FALL RISK PREVENTION PERTAINING TO THE HOME:  Any stairs in or around the home? No  If so, are there any without handrails? No  Home free of loose throw rugs in walkways, pet beds, electrical cords, etc? Yes  Adequate lighting in your home to reduce risk of falls? Yes   ASSISTIVE DEVICES UTILIZED TO PREVENT FALLS:  Life alert? No  Use of a cane, walker or w/c? No  Grab bars in the bathroom? Yes  Shower chair or bench in shower? Yes  Elevated toilet seat or a handicapped toilet? Yes   TIMED UP AND GO:  Was the test performed? Yes .  Length of time to ambulate 10 feet: 8 sec.   Gait steady and fast without use of assistive device  Cognitive Function:        01/08/2023    2:16 PM  6CIT Screen  What Year? 0 points  What month? 0 points  What time? 0 points  Count back from 20 0 points  Months in reverse 0 points  Repeat phrase 0 points  Total Score 0 points    Immunizations Immunization History  Administered Date(s) Administered   Influenza-Unspecified 06/21/2021   MODERNA COVID-19 SARS-COV-2 PEDS BIVALENT BOOSTER 6Y-11Y 10/01/2020, 02/11/2021, 07/10/2021   Moderna Sars-Covid-2 Vaccination 09/19/2019, 10/17/2019   Tdap 09/21/2013    TDAP status: Up  to date  Flu Vaccine status: Up to date  Pneumococcal vaccine status: Due, Education has been provided regarding the importance of this vaccine. Advised may receive this vaccine at local pharmacy or Health Dept. Aware to provide a copy of the vaccination record if obtained from local pharmacy or Health Dept. Verbalized acceptance and understanding. Pt unsure..has requested records from [place]  Covid-19 vaccine status: Completed vaccines  Qualifies for Shingles Vaccine? Yes   Zostavax completed yes   Shingrix Completed?: Yes  Screening Tests Health Maintenance  Topic Date Due   Pneumonia Vaccine 60+ Years old (1 of 1 - PCV) Never done   COVID-19 Vaccine (6 - 2023-24 season) 05/22/2022   Zoster Vaccines- Shingrix (1 of 2) 02/11/2023 (Originally 09/25/2005)   INFLUENZA VACCINE  04/22/2023   DTaP/Tdap/Td (2 - Td or Tdap) 09/22/2023   Medicare Annual Wellness (AWV)  01/08/2024   MAMMOGRAM  01/27/2024   COLONOSCOPY (Pts 45-45yrs Insurance coverage will need to be confirmed)  12/19/2024   DEXA SCAN  Completed   Hepatitis C Screening  Completed   HPV VACCINES  Aged Out    Health Maintenance  Health Maintenance Due  Topic Date Due   Pneumonia Vaccine 83+ Years old (1 of 1 - PCV) Never done   COVID-19 Vaccine (6 - 2023-24 season) 05/22/2022    Colorectal cancer screening: Type of screening: Colonoscopy. Completed yes. Repeat every 3 years  Mammogram status: Ordered yes. Pt provided with contact info and advised to call to schedule appt.   Bone Density status: Completed yes. Results reflect: Bone density results: NORMAL. Repeat every 5 years.  Lung Cancer Screening: (Low Dose CT Chest recommended if Age 58-80 years, 30 pack-year currently smoking OR have quit w/in 15years.) does not qualify.   Lung Cancer Screening Referral: no  Additional Screening:  Hepatitis C Screening: does not qualify; Completed yes  Vision Screening: Recommended annual ophthalmology exams for early detection of glaucoma and other disorders of the eye. Is the patient up to date with their annual eye exam?  Yes  Who is the provider or what is the name of the office in which the patient attends annual eye exams? Dr Inez Pilgrim If pt is not established with a provider, would they like to be referred to a provider to establish care? No .   Dental Screening: Recommended annual dental exams for proper oral hygiene  Community Resource Referral / Chronic Care Management: CRR required this visit?  No   CCM  required this visit?  No      Plan:     I have personally reviewed and noted the following in the patient's chart:   Medical and social history Use of alcohol, tobacco or illicit drugs  Current medications and supplements including opioid prescriptions. Patient is not currently taking opioid prescriptions. Functional ability and status Nutritional status Physical activity Advanced directives List of other physicians Hospitalizations, surgeries, and ER visits in previous 12 months Vitals Screenings to include cognitive, depression, and falls Referrals and appointments  In addition, I have reviewed and discussed with patient certain preventive protocols, quality metrics, and best practice recommendations. A written personalized care plan for preventive services as well as general preventive health recommendations were provided to patient.     Sue Lush, LPN   1/61/0960   Nurse Notes: The patient states she is doing well and has no concerns or questions at this time.

## 2023-01-12 ENCOUNTER — Ambulatory Visit: Payer: Medicare Other

## 2023-01-12 DIAGNOSIS — M25612 Stiffness of left shoulder, not elsewhere classified: Secondary | ICD-10-CM

## 2023-01-12 DIAGNOSIS — M25512 Pain in left shoulder: Secondary | ICD-10-CM | POA: Diagnosis not present

## 2023-01-12 DIAGNOSIS — G8929 Other chronic pain: Secondary | ICD-10-CM

## 2023-01-12 NOTE — Therapy (Signed)
OUTPATIENT PHYSICAL THERAPY TREATMENT NOTE   Patient Name: Melissa Malone MRN: [id number] DOB:1955-11-21, 67 y.o., female Today's Date: 01/12/2023  PCP: Margarita Mail, DO  REFERRING PROVIDER: Margarita Mail, DO   END OF SESSION:  PT End of Session - 01/12/23 1026     Visit Number 2    Number of Visits 17    Date for PT Re-Evaluation 03/05/23    PT Start Time 1027   pt arrived late   PT Stop Time 1100    PT Time Calculation (min) 33 min    Activity Tolerance Patient tolerated treatment well    Behavior During Therapy WFL for tasks assessed/performed             Past Medical History:  Diagnosis Date   Arthritis    Depression    Eczema    Environmental allergies    Glaucoma    H/O cold sores    Hyperlipidemia    IBS (irritable bowel syndrome)    Insomnia    Macular degeneration    Over weight    Past Surgical History:  Procedure Laterality Date   BREAST EXCISIONAL BIOPSY Right 1989   neg   COLONOSCOPY WITH PROPOFOL N/A 12/19/2021   Procedure: COLONOSCOPY WITH PROPOFOL;  Surgeon: Toney Reil, MD;  Location: [facility] ENDOSCOPY;  Service: Gastroenterology;  Laterality: N/A;   DILATION AND CURETTAGE OF UTERUS     INCISION AND DRAINAGE BREAST ABSCESS     SKIN BIOPSY     Patient Active Problem List   Diagnosis Date Noted   Family history of colon cancer    Colon cancer screening    Polyp of transverse colon    Hyperlipemia 11/04/2021   Macular degeneration 11/04/2021   Environmental allergies 11/04/2021   Arthritis 11/04/2021   Glaucoma 11/04/2021   MDD (major depressive disorder) 11/04/2021   Eczema 11/04/2021   H/O cold sores 11/04/2021   Insomnia 11/04/2021   Statin intolerance 11/04/2021    REFERRING DIAG: M25.512,G89.29 (ICD-10-CM) - Chronic left shoulder pain   THERAPY DIAG:  Chronic left shoulder pain  Stiffness of left shoulder, not elsewhere classified  Rationale for Evaluation and Treatment Rehabilitation  PERTINENT HISTORY:  Chronic L shoulder pain. Sudden onset. Pt was putting an empty box up to the attic, the box fell and pt over flexed (past end range flexion) her L arm and shoulder resulting in the injury December 2023. Pain has worsened since onset. Has not yet had PT for her L shoulder. Pt is R hand dominant.   PRECAUTIONS: Latex allergies   SUBJECTIVE:   SUBJECTIVE STATEMENT: L shoulder is hurting a little more. 0/10 currently at rest, 8/10 when reaching behind her back. 4/10 when raising her L arm up.   PAIN:  Are you having pain? See subjective   TODAY'S TREATMENT:  DATE: 01/12/2023   Latex allergies Perfume No blood pressure problems per pt.  No osteoporosis     Decreased L shoulder pain with flexion with addition of L scapular retraction and posterior tipping.    Therapeutic exercise    Seated self inferior mob L shoulder 10x5 seconds for 3 sets  Decreased pain with shoulder flexion   Standing B scapular retraction red band 10x5 seconds for 3 sets  Standing B shoulder ER yellow band 10x3  L scapular winging observed  Decreased pain with L shoulder flexion afterwards  Supine serratus reach blue band  L 8 x 5 seconds    Improved exercise technique, movement at target joints, use of target muscles after mod verbal, visual, tactile cues.      Manual therapy  Supine STM L teres major muscle teo decrease tension  Decreased L shoulder pain with flexion afterwards     Response to treatment Decreased pain with shoulder flexion and reaching behind her back after treatment.        Clinical impression Pt arrived late so session was adjusted accordingly. Worked on improving inferior joint capsule mobility, decreasing teres major muscle tension, improving scapular and ER strength to promote better glenohumeral joint mechanics. Decreased L shoulder pain  with flexion and functional IR reported after treatment. Pt will benefit from continued skilled physical therapy services to decrease pain, improve strength, AROM, and function.      PATIENT EDUCATION: Education details: there-ex, HEP Person educated: Patient Education method: Explanation, Demonstration, Tactile cues, Verbal cues, and Handouts Education comprehension: verbalized understanding and returned demonstration  HOME EXERCISE PROGRAM: Access Code: [email or web address] URL: [email or web address] Date: 01/07/2023 Prepared by: Loralyn Freshwater   Exercises - Isometric Shoulder Internal Rotation  - 2 x daily - 7 x weekly - 3 sets - 10 reps - 5 seconds hold - Single Arm Shoulder Extension with Anchored Resistance  - 1 x daily - 7 x weekly - 3 sets - 10 reps - 5 seconds hold  - Seated Shoulder Inferior Glide  - 1 x daily - 7 x weekly - 3 sets - 10 reps - 5 seconds hold   PT Short Term Goals - 01/07/23 1253       PT SHORT TERM GOAL #1   Title Pt will be independent with her initial HEP to decrease pain, improve strength, ability to reach and don and doff articles of clothing more comfortably.    Baseline Pt has started her initial HEP (01/07/2023)    Time 3    Period Weeks    Status New    Target Date 01/29/23              PT Long Term Goals - 01/07/23 1254       PT LONG TERM GOAL #1   Title Pt will have a decrease in L shoulder pain to 2/10 or less at worst to promote ability to reach as well as don and doff articles of clothing more comfortably.    Baseline 8/10 L shoulder pain at worst for the past 3 months (01/07/2023)    Time 8    Period Weeks    Status New    Target Date 03/05/23      PT LONG TERM GOAL #2   Title Pt will improve her L shoulder functional IR AROM to thumb to T7 spinous process to decrease difficulty donning and doffing her bra.    Baseline L shoulder functional IR AROM: Functional IR: L thumb to L 1 spinous  process. (01/07/2023)    Time 8     Period Weeks    Status New    Target Date 03/05/23      PT LONG TERM GOAL #3   Title Pt will improve her L shoulder flexion AROM to 155 degrees or more to promote ability to reach more comfortably.    Baseline L shoulder flexion AROM 140 degrees AROM (01/07/2023)    Time 8    Period Weeks    Status New    Target Date 03/05/23      PT LONG TERM GOAL #4   Title Pt will improve her L shoulder FOTO score by at least 10 points as a demonstration of improved function.    Baseline L shoulder FOTO 63 (01/07/2023)    Time 8    Period Weeks    Status New    Target Date 03/05/23              Plan - 01/12/23 1025     Clinical Impression Statement Pt arrived late so session was adjusted accordingly. Worked on improving inferior joint capsule mobility, decreasing teres major muscle tension, improving scapular and ER strength to promote better glenohumeral joint mechanics. Decreased L shoulder pain with flexion and functional IR reported after treatment. Pt will benefit from continued skilled physical therapy services to decrease pain, improve strength, AROM, and function.    Personal Factors and Comorbidities Comorbidity 3+;Age;Time since onset of injury/illness/exacerbation;Fitness    Comorbidities Arthritis, depression, insomnia    Examination-Activity Limitations Reach Overhead;Dressing;Hygiene/Grooming;Lift;Sleep    Stability/Clinical Decision Making Stable/Uncomplicated   Pain is worsening per subjective reports   Rehab Potential Fair    PT Frequency 2x / week    PT Duration 8 weeks    PT Treatment/Interventions Therapeutic exercise;Therapeutic activities;Neuromuscular re-education;Patient/family education;Manual techniques;Dry needling;Electrical Stimulation;Iontophoresis /ml Dexamethasone    PT Next Visit Plan scapular, rotator cuff strengthening. glenohumeral mechanics, manual techniques, modalities PRN    PT Home Exercise Plan Medbridge Access Code: [email or web address]    Consulted and Agree  with Plan of Care Patient              Loralyn Freshwater PT, DPT  01/12/2023, 3:24 PM

## 2023-01-14 ENCOUNTER — Ambulatory Visit: Payer: Medicare Other

## 2023-01-14 DIAGNOSIS — M25612 Stiffness of left shoulder, not elsewhere classified: Secondary | ICD-10-CM

## 2023-01-14 DIAGNOSIS — M25512 Pain in left shoulder: Secondary | ICD-10-CM | POA: Diagnosis not present

## 2023-01-14 DIAGNOSIS — G8929 Other chronic pain: Secondary | ICD-10-CM

## 2023-01-14 NOTE — Progress Notes (Signed)
Established Patient Office Visit  Subjective:  Patient ID: Melissa Malone, female    DOB: 11/13/1955  Age: 66 y.o. MRN: [id number]  CC:  Chief Complaint  Patient presents with   Follow-up   Medication Refill    Eczema cream    HPI Melissa Malone presents to follow up on chronic medical conditions. Patient recently started physical therapy for her shoulder pain, this is overall going well.  HLD: -Medications: Had been on Vascepa 2 g BID but it is too expensive.  She is taking Fish oil.has a history of statin intolerance - history of rhabdomyolysis  -Patient is compliant with above medications and reports no side effects.  -Last lipid panel: Lipid Panel     Component Value Date/Time   CHOL 279 (H) 11/04/2021 1026   TRIG 289 (H) 11/04/2021 1026   HDL 50 11/04/2021 1026   CHOLHDL 5.6 (H) 11/04/2021 1026   LDLCALC 177 (H) 11/04/2021 1026   The 10-year ASCVD risk score (Arnett DK, et al., 2019) is: 7.5%   Values used to calculate the score:     Age: 5 years     Sex: Female     Is Non-Hispanic African American: No     Diabetic: No     Tobacco smoker: No     Systolic Blood Pressure: 122 mmHg     Is BP treated: No     HDL Cholesterol: 50 mg/dL     Total Cholesterol: 279 mg/dL  Macular Degeneration both wet and dry: -Right every 6 weeks and left every 10 weeks -Following with Ophthalmology at [facility]   MDD: -Mood status: stable -Current treatment: Had been on Lexapro 5 milligrams but not currently on anything-Satisfied with current treatment?: yes     01/15/2023    2:10 PM 01/08/2023    2:04 PM 11/13/2022    2:36 PM 12/12/2021    2:03 PM 11/04/2021    9:37 AM  Depression screen PHQ 2/9  Decreased Interest 0 0 0 0 0  Down, Depressed, Hopeless 0 0 0 0 1  PHQ - 2 Score 0 0 0 0 1  Altered sleeping 0 0 0 0 2  Tired, decreased energy 0 0 0 0 1  Change in appetite 0 0 0 0 2  Feeling bad or failure about yourself  0 0 0 0 0  Trouble concentrating 0 0 0 0 0  Moving  slowly or fidgety/restless 0 0 0 0 0  Suicidal thoughts 0 0 0 0 0  PHQ-9 Score 0 0 0 0 6  Difficult doing work/chores Not difficult at all Not difficult at all Not difficult at all Not difficult at all Somewhat difficult   Seasonal Allergies: -Currently on Zyrtec   Health Maintenance: -Blood work due  -Mammogram due -Colon ca screening: Colonoscopy 12/19/2021, repeat in 3 years  Past Medical History:  Diagnosis Date   Arthritis    Depression    Eczema    Environmental allergies    Glaucoma    H/O cold sores    Hyperlipidemia    IBS (irritable bowel syndrome)    Insomnia    Macular degeneration    Over weight     Family History  Problem Relation Age of Onset   Arthritis Mother    Diabetes Mother    Heart disease Mother    COPD Mother    Dementia Mother    Macular degeneration Mother    Macular degeneration Father    Arthritis Father  Heart disease Father    Cancer Father        lung   COPD Father    Macular degeneration Sister    Diabetes Sister    Obesity Sister    Irregular heart beat Sister    Irregular heart beat Brother    Cancer Brother        COLON   Obesity Brother    Depression Daughter    Obesity Daughter     Social History   Socioeconomic History   Marital status: Single    Spouse name: Not on file   Number of children: Not on file   Years of education: Not on file   Highest education level: Not on file  Occupational History   Not on file  Tobacco Use   Smoking status: Never   Smokeless tobacco: Never  Vaping Use   Vaping Use: Never used  Substance and Sexual Activity   Alcohol use: Yes    Comment: 1-2 PER MONTH   Drug use: Never   Sexual activity: Not Currently  Other Topics Concern   Not on file  Social History Narrative   Not on file   Social Determinants of Health   Financial Resource Strain: Low Risk  (01/08/2023)   Overall Financial Resource Strain (CARDIA)    Difficulty of Paying Living Expenses: Not hard at all   Food Insecurity: No Food Insecurity (01/08/2023)   Hunger Vital Sign    Worried About Running Out of Food in the Last Year: Never true    Ran Out of Food in the Last Year: Never true  Transportation Needs: No Transportation Needs (01/08/2023)   PRAPARE - Administrator, Civil Service (Medical): No    Lack of Transportation (Non-Medical): No  Physical Activity: Insufficiently Active (01/08/2023)   Exercise Vital Sign    Days of Exercise per Week: 1 day    Minutes of Exercise per Session: 60 min  Stress: No Stress Concern Present (01/08/2023)   [employer] of Occupational Health - Occupational Stress Questionnaire    Feeling of Stress : Only a little  Social Connections: Moderately Isolated (01/08/2023)   Social Connection and Isolation Panel [NHANES]    Frequency of Communication with Friends and Family: Twice a week    Frequency of Social Gatherings with Friends and Family: Three times a week    Attends Religious Services: More than 4 times per year    Active Member of Clubs or Organizations: No    Attends Banker Meetings: Never    Marital Status: Never married  Intimate Partner Violence: Not At Risk (01/08/2023)   Humiliation, Afraid, Rape, and Kick questionnaire    Fear of Current or Ex-Partner: No    Emotionally Abused: No    Physically Abused: No    Sexually Abused: No    ROS Review of Systems  Constitutional:  Negative for chills and fever.  Respiratory:  Negative for cough and shortness of breath.   Cardiovascular:  Negative for chest pain.  Gastrointestinal:  Negative for abdominal pain.  Neurological:  Negative for dizziness and headaches.    Objective:   Today's Vitals: BP 122/66   Pulse 95   Temp 98.1 F (36.7 C)   Resp 16   Ht 5' 0.03" (1.525 m)   Wt 147 lb 1.6 oz (66.7 kg)   SpO2 95%   BMI 28.70 kg/m   Physical Exam Constitutional:      Appearance: Normal appearance.  HENT:  Head: Normocephalic and atraumatic.  Eyes:      Conjunctiva/sclera: Conjunctivae normal.  Cardiovascular:     Rate and Rhythm: Normal rate and regular rhythm.  Pulmonary:     Effort: Pulmonary effort is normal.     Breath sounds: Normal breath sounds.  Skin:    General: Skin is warm and dry.  Neurological:     General: No focal deficit present.     Mental Status: She is alert. Mental status is at baseline.  Psychiatric:        Mood and Affect: Mood normal.        Behavior: Behavior normal.     Assessment & Plan:   1. Mixed hyperlipidemia/Hypertriglyceridemia: Not on any prescription medication, just taking over-the-counter fish oil.  Vascepa was too expensive and she cannot tolerate statins due to history of rhabdomyolysis.  Recheck fasting lipid panel.  CBC and CMP due as well.  - CBC w/Diff/Platelet - COMPLETE METABOLIC PANEL WITH GFR - Lipid Profile  2. Eczema, unspecified type: Labs due, refill cream.  - CBC w/Diff/Platelet - COMPLETE METABOLIC PANEL WITH GFR - pimecrolimus (ELIDEL) 1 % cream; Apply topically as needed.  Dispense: 30 g; Refill: 0  3. H/O cold sores: No recent outbreaks, refill acyclovir ointment.  - acyclovir ointment (ZOVIRAX) 5 %; Apply 1 Application topically as needed.  Dispense: 30 g; Refill: 1  4. Encounter for screening mammogram for malignant neoplasm of breast: Mammogram ordered.  - MM 3D SCREENING MAMMOGRAM BILATERAL BREAST; Future  5. Vaccine for streptococcus pneumoniae and influenza: Prevnar 20 administered.  - Pneumococcal conjugate vaccine 20-valent (Prevnar 20)   Follow-up: Return in about 6 months (around 07/17/2023).   Margarita Mail, DO

## 2023-01-14 NOTE — Therapy (Signed)
OUTPATIENT PHYSICAL THERAPY TREATMENT NOTE   Patient Name: Melissa Malone MRN: [id number] DOB:02/18/1956, 67 y.o., female Today's Date: 01/14/2023  PCP: Margarita Mail, DO  REFERRING PROVIDER: Margarita Mail, DO   END OF SESSION:  PT End of Session - 01/14/23 1019     Visit Number 3    Number of Visits 17    Date for PT Re-Evaluation 03/05/23    PT Start Time 1019    PT Stop Time 1105    PT Time Calculation (min) 46 min    Activity Tolerance Patient tolerated treatment well    Behavior During Therapy WFL for tasks assessed/performed              Past Medical History:  Diagnosis Date   Arthritis    Depression    Eczema    Environmental allergies    Glaucoma    H/O cold sores    Hyperlipidemia    IBS (irritable bowel syndrome)    Insomnia    Macular degeneration    Over weight    Past Surgical History:  Procedure Laterality Date   BREAST EXCISIONAL BIOPSY Right 1989   neg   COLONOSCOPY WITH PROPOFOL N/A 12/19/2021   Procedure: COLONOSCOPY WITH PROPOFOL;  Surgeon: Toney Reil, MD;  Location: [facility] END[facility]COPY;  Service: Gastroenterology;  Laterality: N/A;   DILATION AND CURETTAGE OF UTERUS     INCISION AND DRAINAGE BREAST ABSCESS     SKIN BIOPSY     Patient Active Problem List   Diagnosis Date Noted   Family history of colon cancer    Colon cancer screening    Polyp of transverse colon    Hyperlipemia 11/04/2021   Macular degeneration 11/04/2021   Environmental allergies 11/04/2021   Arthritis 11/04/2021   Glaucoma 11/04/2021   MDD (major depressive disorder) 11/04/2021   Eczema 11/04/2021   H/O cold sores 11/04/2021   Insomnia 11/04/2021   Statin intolerance 11/04/2021    REFERRING DIAG: M25.512,G89.29 (ICD-10-CM) - Chronic left shoulder pain   THERAPY DIAG:  Chronic left shoulder pain  Stiffness of left shoulder, not elsewhere classified  Rationale for Evaluation and Treatment Rehabilitation  PERTINENT HISTORY: Chronic L  shoulder pain. Sudden onset. Pt was putting an empty box up to the attic, the box fell and pt over flexed (past end range flexion) her L arm and shoulder resulting in the injury December 2023. Pain has worsened since onset. Has not yet had PT for her L shoulder. Pt is R hand dominant.   PRECAUTIONS: Latex allergies   SUBJECTIVE:   SUBJECTIVE STATEMENT: L shoulder is a 1/10 at rest, did her exercises about 30 minutes ago. 2/10 when raising her L arm up, 8/10 when reaching behind her back.     PAIN:  Are you having pain? See subjective   TODAY'S TREATMENT:  DATE: 01/14/2023   Latex allergies Perfume No blood pressure problems per pt.  No osteoporosis     Decreased L shoulder pain with flexion with addition of L scapular retraction and posterior tipping.      Manual therapy Seated STM L anterior shoulder and upper trap to improve fascial mobility    Therapeutic exercise   L shoulder IR isometrics, hand on abdomen 10x5 seconds for 3 sets  Standing B scapular retraction red band 10x5 seconds for 3 sets  Standing B shoulder ER yellow band 10x3  L scapular winging observed  Prone on elbows  B scapular protraction 10x3 with 5 second holds    Improved exercise technique, movement at target joints, use of target muscles after mod verbal, visual, tactile cues.       Response to treatment Decreased pain with shoudler flexion. Still demonstrates similar pain with functional IR     Clinical impression  Worked on decreasing fascial restrictions L anterior shoulder to improve movement. Worked on improving scapular and rotator cuff strength to improve glenohumeral mechanics and decrease stress to affected areas. Decreased pain with shoulder flexion.  Pt will benefit from continued skilled physical therapy services to decrease pain, improve  strength, AROM, and function.      PATIENT EDUCATION: Education details: there-ex, HEP Person educated: Patient Education method: Explanation, Demonstration, Tactile cues, Verbal cues, and Handouts Education comprehension: verbalized understanding and returned demonstration  HOME EXERCISE PROGRAM: Access Code: [email or web address] URL: [email or web address] Date: 01/07/2023 Prepared by: Loralyn Freshwater   Exercises - Isometric Shoulder Internal Rotation  - 2 x daily - 7 x weekly - 3 sets - 10 reps - 5 seconds hold - Single Arm Shoulder Extension with Anchored Resistance  - 1 x daily - 7 x weekly - 3 sets - 10 reps - 5 seconds hold  - Seated Shoulder Inferior Glide  - 1 x daily - 7 x weekly - 3 sets - 10 reps - 5 seconds hold  - Scapular Retraction with Resistance  - 1 x daily - 7 x weekly - 3 sets - 10 reps - 5 seconds hold Red band  - Prone Scapular Protraction Retraction AROM on Forearms  - 1 x daily - 7 x weekly - 3 sets - 10 reps - 5 seconds hold     PT Short Term Goals - 01/07/23 1253       PT SHORT TERM GOAL #1   Title Pt will be independent with her initial HEP to decrease pain, improve strength, ability to reach and don and doff articles of clothing more comfortably.    Baseline Pt has started her initial HEP (01/07/2023)    Time 3    Period Weeks    Status New    Target Date 01/29/23              PT Long Term Goals - 01/07/23 1254       PT LONG TERM GOAL #1   Title Pt will have a decrease in L shoulder pain to 2/10 or less at worst to promote ability to reach as well as don and doff articles of clothing more comfortably.    Baseline 8/10 L shoulder pain at worst for the past 3 months (01/07/2023)    Time 8    Period Weeks    Status New    Target Date 03/05/23      PT LONG TERM GOAL #2   Title Pt will improve her L shoulder functional IR AROM to thumb  to T7 spinous process to decrease difficulty donning and doffing her bra.    Baseline L shoulder  functional IR AROM: Functional IR: L thumb to L 1 spinous process. (01/07/2023)    Time 8    Period Weeks    Status New    Target Date 03/05/23      PT LONG TERM GOAL #3   Title Pt will improve her L shoulder flexion AROM to 155 degrees or more to promote ability to reach more comfortably.    Baseline L shoulder flexion AROM 140 degrees AROM (01/07/2023)    Time 8    Period Weeks    Status New    Target Date 03/05/23      PT LONG TERM GOAL #4   Title Pt will improve her L shoulder FOTO score by at least 10 points as a demonstration of improved function.    Baseline L shoulder FOTO 63 (01/07/2023)    Time 8    Period Weeks    Status New    Target Date 03/05/23              Plan - 01/14/23 1019     Clinical Impression Statement Worked on decreasing fascial restrictions L anterior shoulder to improve movement. Worked on improving scapular and rotator cuff strength to improve glenohumeral mechanics and decrease stress to affected areas. Decreased pain with shoulder flexion.  Pt will benefit from continued skilled physical therapy services to decrease pain, improve strength, AROM, and function.    Personal Factors and Comorbidities Comorbidity 3+;Age;Time since onset of injury/illness/exacerbation;Fitness    Comorbidities Arthritis, depression, insomnia    Examination-Activity Limitations Reach Overhead;Dressing;Hygiene/Grooming;Lift;Sleep    Stability/Clinical Decision Making Stable/Uncomplicated   Pain is worsening per subjective reports   Rehab Potential Fair    PT Frequency 2x / week    PT Duration 8 weeks    PT Treatment/Interventions Therapeutic exercise;Therapeutic activities;Neuromuscular re-education;Patient/family education;Manual techniques;Dry needling;Electrical Stimulation;Iontophoresis /ml Dexamethasone    PT Next Visit Plan scapular, rotator cuff strengthening. glenohumeral mechanics, manual techniques, modalities PRN    PT Home Exercise Plan Medbridge Access Code:  [email or web address]    Consulted and Agree with Plan of Care Patient              Loralyn Freshwater PT, DPT  01/14/2023, 1:00 PM

## 2023-01-15 ENCOUNTER — Encounter: Payer: Self-pay | Admitting: Internal Medicine

## 2023-01-15 ENCOUNTER — Ambulatory Visit (INDEPENDENT_AMBULATORY_CARE_PROVIDER_SITE_OTHER): Payer: Medicare Other | Admitting: Internal Medicine

## 2023-01-15 VITALS — BP 122/66 | HR 95 | Temp 98.1°F | Resp 16 | Ht 60.03 in | Wt 147.1 lb

## 2023-01-15 DIAGNOSIS — E782 Mixed hyperlipidemia: Secondary | ICD-10-CM | POA: Diagnosis not present

## 2023-01-15 DIAGNOSIS — L309 Dermatitis, unspecified: Secondary | ICD-10-CM

## 2023-01-15 DIAGNOSIS — E781 Pure hyperglyceridemia: Secondary | ICD-10-CM | POA: Diagnosis not present

## 2023-01-15 DIAGNOSIS — Z23 Encounter for immunization: Secondary | ICD-10-CM

## 2023-01-15 DIAGNOSIS — Z1231 Encounter for screening mammogram for malignant neoplasm of breast: Secondary | ICD-10-CM

## 2023-01-15 DIAGNOSIS — Z8619 Personal history of other infectious and parasitic diseases: Secondary | ICD-10-CM | POA: Diagnosis not present

## 2023-01-15 MED ORDER — PIMECROLIMUS 1 % EX CREA: TOPICAL_CREAM | CUTANEOUS | 0 refills | Status: AC | PRN

## 2023-01-15 MED ORDER — ACYCLOVIR 5 % EX OINT: 1.0000 | TOPICAL_OINTMENT | CUTANEOUS | 1 refills | Status: AC | PRN

## 2023-01-18 ENCOUNTER — Ambulatory Visit: Payer: Medicare Other

## 2023-01-18 DIAGNOSIS — M25612 Stiffness of left shoulder, not elsewhere classified: Secondary | ICD-10-CM

## 2023-01-18 DIAGNOSIS — M25512 Pain in left shoulder: Secondary | ICD-10-CM | POA: Diagnosis not present

## 2023-01-18 DIAGNOSIS — G8929 Other chronic pain: Secondary | ICD-10-CM

## 2023-01-18 NOTE — Therapy (Signed)
OUTPATIENT PHYSICAL THERAPY TREATMENT NOTE   Patient Name: Melissa Malone MRN: [id number] DOB:10-17-1955, 67 y.o., female Today's Date: 01/18/2023  PCP: Margarita Mail, DO  REFERRING PROVIDER: Margarita Mail, DO   END OF SESSION:  PT End of Session - 01/18/23 0936     Visit Number 4    Number of Visits 17    Date for PT Re-Evaluation 03/05/23    PT Start Time 0936   pt arrived late   PT Stop Time 1016    PT Time Calculation (min) 40 min    Activity Tolerance Patient tolerated treatment well    Behavior During Therapy [facility] for tasks assessed/performed               Past Medical History:  Diagnosis Date   Arthritis    Depression    Eczema    Environmental allergies    Glaucoma    H/O cold sores    Hyperlipidemia    IBS (irritable bowel syndrome)    Insomnia    Macular degeneration    Over weight    Past Surgical History:  Procedure Laterality Date   BREAST EXCISIONAL BIOPSY Right 1989   neg   COLONOSCOPY WITH PROPOFOL N/A 12/19/2021   Procedure: COLONOSCOPY WITH PROPOFOL;  Surgeon: Toney Reil, MD;  Location: [facility] ENDOSCOPY;  Service: Gastroenterology;  Laterality: N/A;   DILATION AND CURETTAGE OF UTERUS     INCISION AND DRAINAGE BREAST ABSCESS     SKIN BIOPSY     Patient Active Problem List   Diagnosis Date Noted   Family history of colon cancer    Colon cancer screening    Polyp of transverse colon    Hyperlipemia 11/04/2021   Macular degeneration 11/04/2021   Environmental allergies 11/04/2021   Arthritis 11/04/2021   Glaucoma 11/04/2021   MDD (major depressive disorder) 11/04/2021   Eczema 11/04/2021   H/O cold sores 11/04/2021   Insomnia 11/04/2021   Statin intolerance 11/04/2021    REFERRING DIAG: M25.512,G89.29 (ICD-10-CM) - Chronic left shoulder pain   THERAPY DIAG:  Chronic left shoulder pain  Stiffness of left shoulder, not elsewhere classified  Rationale for Evaluation and Treatment Rehabilitation  PERTINENT  HISTORY: Chronic L shoulder pain. Sudden onset. Pt was putting an empty box up to the attic, the box fell and pt over flexed (past end range flexion) her L arm and shoulder resulting in the injury December 2023. Pain has worsened since onset. Has not yet had PT for her L shoulder. Pt is R hand dominant.   PRECAUTIONS: Latex allergies   SUBJECTIVE:   SUBJECTIVE STATEMENT: L shoulder is ok. The reaching behind her back bothers her. 2/10 when raising her arm up, 8/10 when reaching behind her back. Pain is on top of her shoulder and is no longer behind her shoulder blade when reaching behind her. Decreased Naproxen from 2 to 1 at night now.     PAIN:  Are you having pain? See subjective   TODAY'S TREATMENT:  DATE: 01/18/2023   Latex allergies Perfume No blood pressure problems per pt.  No osteoporosis     Decreased L shoulder pain with flexion with addition of L scapular retraction and posterior tipping.      Manual therapy  Supine inferior glide L humeral head grade 3 to decrease stiffness. Uncomfortable L lateral shoulder reported. Stopped treatment.     Therapeutic exercise Seated L shoulder self inferior mob/glide 10x3 with 5 second holds   Decreased pain with functional L shoulder IR  Seated manually resisted L triceps extension isometrics with slight shoulder flexion and less than 90 degree elbow flexion 10x3 with 5 seconds   Good posterior glide of L humeral head palpated  Standing L shoulder functional IR with PT manual   long axis joint distraction 10x3   Improve L shoulder functional IR, with pt being able to reach her bra strap.    A to P pressure to humeral head 10x3     Improve L shoulder functional IR, with pt being able to reach her bra strap.   Standing L elbow extension isometrics resisting R hand  2x5 seconds. Reviewed  and given as part of HEP 10x3 with 5 second holds per day.        Improved exercise technique, movement at target joints, use of target muscles after mod verbal, visual, tactile cues.       Response to treatment Improved L shoulder functional IR with treatment to improve posterior and inferior glide of humeral head.      Clinical impression  Improved L shoulder functional IR with treatment to improve posterior and inferior glide of humeral head. Pt tolerated session well without aggravation of symptoms. Pt will benefit from continued skilled physical therapy services to decrease pain, improve strength, AROM, and function.      PATIENT EDUCATION: Education details: there-ex, HEP Person educated: Patient Education method: Explanation, Demonstration, Tactile cues, Verbal cues, and Handouts Education comprehension: verbalized understanding and returned demonstration  HOME EXERCISE PROGRAM: Access Code: [email or web address] URL: [email or web address] Date: 01/07/2023 Prepared by: Loralyn Freshwater   Exercises - Isometric Shoulder Internal Rotation  - 2 x daily - 7 x weekly - 3 sets - 10 reps - 5 seconds hold - Single Arm Shoulder Extension with Anchored Resistance  - 1 x daily - 7 x weekly - 3 sets - 10 reps - 5 seconds hold  - Seated Shoulder Inferior Glide  - 1 x daily - 7 x weekly - 3 sets - 10 reps - 5 seconds hold  - Scapular Retraction with Resistance  - 1 x daily - 7 x weekly - 3 sets - 10 reps - 5 seconds hold Red band  - Prone Scapular Protraction Retraction AROM on Forearms  - 1 x daily - 7 x weekly - 3 sets - 10 reps - 5 seconds hold  Standing L elbow extension isometrics resisting R hand  2x5 seconds. Reviewed and given as part of HEP 10x3 with 5 second holds per day.      PT Short Term Goals - 01/07/23 1253       PT SHORT TERM GOAL #1   Title Pt will be independent with her initial HEP to decrease pain, improve strength, ability to reach and don and doff  articles of clothing more comfortably.    Baseline Pt has started her initial HEP (01/07/2023)    Time 3    Period Weeks    Status New    Target Date 01/29/23  PT Long Term Goals - 01/07/23 1254       PT LONG TERM GOAL #1   Title Pt will have a decrease in L shoulder pain to 2/10 or less at worst to promote ability to reach as well as don and doff articles of clothing more comfortably.    Baseline 8/10 L shoulder pain at worst for the past 3 months (01/07/2023)    Time 8    Period Weeks    Status New    Target Date 03/05/23      PT LONG TERM GOAL #2   Title Pt will improve her L shoulder functional IR AROM to thumb to T7 spinous process to decrease difficulty donning and doffing her bra.    Baseline L shoulder functional IR AROM: Functional IR: L thumb to L 1 spinous process. (01/07/2023)    Time 8    Period Weeks    Status New    Target Date 03/05/23      PT LONG TERM GOAL #3   Title Pt will improve her L shoulder flexion AROM to 155 degrees or more to promote ability to reach more comfortably.    Baseline L shoulder flexion AROM 140 degrees AROM (01/07/2023)    Time 8    Period Weeks    Status New    Target Date 03/05/23      PT LONG TERM GOAL #4   Title Pt will improve her L shoulder FOTO score by at least 10 points as a demonstration of improved function.    Baseline L shoulder FOTO 63 (01/07/2023)    Time 8    Period Weeks    Status New    Target Date 03/05/23              Plan - 01/18/23 0936     Clinical Impression Statement Improved L shoulder functional IR with treatment to improve posterior and inferior glide of humeral head. Pt tolerated session well without aggravation of symptoms. Pt will benefit from continued skilled physical therapy services to decrease pain, improve strength, AROM, and function.    Personal Factors and Comorbidities Comorbidity 3+;Age;Time since onset of injury/illness/exacerbation;Fitness    Comorbidities Arthritis,  depression, insomnia    Examination-Activity Limitations Reach Overhead;Dressing;Hygiene/Grooming;Lift;Sleep    Stability/Clinical Decision Making Stable/Uncomplicated   Pain is worsening per subjective reports   Rehab Potential Fair    PT Frequency 2x / week    PT Duration 8 weeks    PT Treatment/Interventions Therapeutic exercise;Therapeutic activities;Neuromuscular re-education;Patient/family education;Manual techniques;Dry needling;Electrical Stimulation;Iontophoresis 4mg /ml Dexamethasone    PT Next Visit Plan scapular, rotator cuff strengthening. glenohumeral mechanics, manual techniques, modalities PRN    PT Home Exercise Plan Medbridge Access Code: [email or web address]    Consulted and Agree with Plan of Care Patient              Loralyn Freshwater PT, DPT  01/18/2023, 1:19 PM

## 2023-01-19 ENCOUNTER — Telehealth: Payer: Self-pay

## 2023-01-19 NOTE — Telephone Encounter (Signed)
Summer with [facility], calling to get clinical information for pt's pimecrolimus (ELIDEL) 1 % cream. Advised Associated Diagnoses: Eczema, unspecified type [L30.9] but said it was also for dermatitis unspecified so was able to use ICD 10 L20. 83. No further assistance was needed.

## 2023-01-20 LAB — LIPID PANEL
Cholesterol: 254 mg/dL — ABNORMAL HIGH (ref <200)
HDL: 47 mg/dL — ABNORMAL LOW (ref >=50)
LDL Cholesterol (Calc): 159 mg/dL (calc) — ABNORMAL HIGH
Non-HDL Cholesterol (Calc): 207 mg/dL (calc) — ABNORMAL HIGH (ref <130)
Total CHOL/HDL Ratio: 5.4 (calc) — ABNORMAL HIGH (ref <5.0)
Triglycerides: 315 mg/dL — ABNORMAL HIGH (ref <150)

## 2023-01-20 LAB — COMPLETE METABOLIC PANEL WITH GFR
AG Ratio: 1.6 (calc) (ref 1.0–2.5)
ALT: 18 U/L (ref 6–29)
AST: 15 U/L (ref 10–35)
Albumin: 4.1 g/dL (ref 3.6–5.1)
Alkaline phosphatase (APISO): 87 U/L (ref 37–153)
BUN: 21 mg/dL (ref 7–25)
CO2: 26 mmol/L (ref 20–32)
Calcium: 9.1 mg/dL (ref 8.6–10.4)
Chloride: 105 mmol/L (ref 98–110)
Creat: 0.68 mg/dL (ref 0.50–1.05)
Globulin: 2.5 g/dL (calc) (ref 1.9–3.7)
Glucose, Bld: 117 mg/dL — ABNORMAL HIGH (ref 65–99)
Potassium: 4.5 mmol/L (ref 3.5–5.3)
Sodium: 138 mmol/L (ref 135–146)
Total Bilirubin: 0.5 mg/dL (ref 0.2–1.2)
Total Protein: 6.6 g/dL (ref 6.1–8.1)
eGFR: 95 mL/min/{1.73_m2} (ref >=60)

## 2023-01-20 LAB — CBC WITH DIFFERENTIAL/PLATELET
Absolute Monocytes: 519 cells/uL (ref 200–950)
Basophils Absolute: 59 cells/uL (ref 0–200)
Basophils Relative: 1 %
Eosinophils Absolute: 378 cells/uL (ref 15–500)
Eosinophils Relative: 6.4 %
HCT: 37.1 % (ref 35.0–45.0)
Hemoglobin: 12.5 g/dL (ref 11.7–15.5)
Lymphs Abs: 2814 cells/uL (ref 850–3900)
MCH: 30 pg (ref 27.0–33.0)
MCHC: 33.7 g/dL (ref 32.0–36.0)
MCV: 89.2 fL (ref 80.0–100.0)
MPV: 10.8 fL (ref 7.5–12.5)
Monocytes Relative: 8.8 %
Neutro Abs: 2130 cells/uL (ref 1500–7800)
Neutrophils Relative %: 36.1 %
Platelets: 272 10*3/uL (ref 140–400)
RBC: 4.16 10*6/uL (ref 3.80–5.10)
RDW: 12.1 % (ref 11.0–15.0)
Total Lymphocyte: 47.7 %
WBC: 5.9 10*3/uL (ref 3.8–10.8)

## 2023-01-21 ENCOUNTER — Ambulatory Visit: Payer: Medicare Other | Attending: Internal Medicine

## 2023-01-21 DIAGNOSIS — M25512 Pain in left shoulder: Secondary | ICD-10-CM | POA: Insufficient documentation

## 2023-01-21 DIAGNOSIS — M25612 Stiffness of left shoulder, not elsewhere classified: Secondary | ICD-10-CM | POA: Insufficient documentation

## 2023-01-21 DIAGNOSIS — G8929 Other chronic pain: Secondary | ICD-10-CM | POA: Diagnosis present

## 2023-01-21 NOTE — Therapy (Signed)
OUTPATIENT PHYSICAL THERAPY TREATMENT    Patient Name: Melissa Malone MRN: [id number] DOB:10-Mar-1956, 67 y.o., female Today's Date: 01/18/2023  PCP: Margarita Mail, DO  REFERRING PROVIDER: Margarita Mail, DO   END OF SESSION:  PT End of Session - 01/18/23 0936     Visit Number 4    Number of Visits 17    Date for PT Re-Evaluation 03/05/23    PT Start Time 0936   pt arrived late   PT Stop Time 1016    PT Time Calculation (min) 40 min    Activity Tolerance Patient tolerated treatment well    Behavior During Therapy [facility] for tasks assessed/performed               Past Medical History:  Diagnosis Date   Arthritis    Depression    Eczema    Environmental allergies    Glaucoma    H/O cold sores    Hyperlipidemia    IBS (irritable bowel syndrome)    Insomnia    Macular degeneration    Over weight    Past Surgical History:  Procedure Laterality Date   BREAST EXCISIONAL BIOPSY Right 1989   neg   COLONOSCOPY WITH PROPOFOL N/A 12/19/2021   Procedure: COLONOSCOPY WITH PROPOFOL;  Surgeon: Toney Reil, MD;  Location: [facility] ENDOSCOPY;  Service: Gastroenterology;  Laterality: N/A;   DILATION AND CURETTAGE OF UTERUS     INCISION AND DRAINAGE BREAST ABSCESS     SKIN BIOPSY     Patient Active Problem List   Diagnosis Date Noted   Family history of colon cancer    Colon cancer screening    Polyp of transverse colon    Hyperlipemia 11/04/2021   Macular degeneration 11/04/2021   Environmental allergies 11/04/2021   Arthritis 11/04/2021   Glaucoma 11/04/2021   MDD (major depressive disorder) 11/04/2021   Eczema 11/04/2021   H/O cold sores 11/04/2021   Insomnia 11/04/2021   Statin intolerance 11/04/2021    REFERRING DIAG: M25.512,G89.29 (ICD-10-CM) - Chronic left shoulder pain   THERAPY DIAG:  Chronic left shoulder pain  Stiffness of left shoulder, not elsewhere classified  Rationale for Evaluation and Treatment Rehabilitation  PERTINENT HISTORY:  Chronic L shoulder pain. Sudden onset. Pt was putting an empty box up to the attic, the box fell and pt over flexed (past end range flexion) her L arm and shoulder resulting in the injury December 2023. Pain has worsened since onset. Has not yet had PT for her L shoulder. Pt is R hand dominant.   PRECAUTIONS: Latex allergies   SUBJECTIVE:   SUBJECTIVE STATEMENT:  Pt doing well today, able to don a bra clasp the 'old way' without pain. HEP is going great!   PAIN:  Are you having pain? No pain today    TODAY'S TREATMENT:  DATE: 01/21/23  Latex allergies   Manual therapy -Left arm long axis distraction, supine, slight abdct, slight flexion x40sec, then repeat with increased abdct/flexion 45 degrees each  -supine Left GHJ inferior glide mobilization grade 3, x30 sec: 1x in 70 degree abdct, 1x in 80 degrees flexion    -supine isometric LUE row into towel row, 15x5secH, tactile cues for scap retraction -supine LUE skull crusher 2lb FW 1x12  -supine Left GHJ alternate IR/ER motor control training c 1lb FW, pain-free range, 10x each  -standing LUE shoulder flexion 1x15 to 90 degrees, ABDCT 1x20 to 90 degrees -standing 'wall pushup off treadmill bar 1x15 (pain-free range)     PATIENT EDUCATION: Education details: there-ex, HEP Person educated: Patient Education method: Explanation, Demonstration, Tactile cues, Verbal cues, and Handouts Education comprehension: verbalized understanding and returned demonstration  HOME EXERCISE PROGRAM: Access Code: [email or web address] URL: [email or web address] Date: 01/07/2023 Prepared by: Loralyn Freshwater   Exercises - Isometric Shoulder Internal Rotation  - 2 x daily - 7 x weekly - 3 sets - 10 reps - 5 seconds hold - Single Arm Shoulder Extension with Anchored Resistance  - 1 x daily - 7 x weekly - 3 sets - 10  reps - 5 seconds hold - Seated Shoulder Inferior Glide  - 1 x daily - 7 x weekly - 3 sets - 10 reps - 5 seconds hold - Scapular Retraction with Resistance  - 1 x daily - 7 x weekly - 3 sets - 10 reps - 5 seconds hold Red band - Prone Scapular Protraction Retraction AROM on Forearms  - 1 x daily - 7 x weekly - 3 sets - 10 reps - 5 seconds hold -Standing L elbow extension isometrics resisting R hand  2x5 seconds. Reviewed and given as part of HEP 10x3 with 5 second holds per day.      PT Short Term Goals - 01/07/23 1253       PT SHORT TERM GOAL #1   Title Pt will be independent with her initial HEP to decrease pain, improve strength, ability to reach and don and doff articles of clothing more comfortably.    Baseline Pt has started her initial HEP (01/07/2023)    Time 3    Period Weeks    Status New    Target Date 01/29/23              PT Long Term Goals - 01/07/23 1254       PT LONG TERM GOAL #1   Title Pt will have a decrease in L shoulder pain to 2/10 or less at worst to promote ability to reach as well as don and doff articles of clothing more comfortably.    Baseline 8/10 L shoulder pain at worst for the past 3 months (01/07/2023)    Time 8    Period Weeks    Status New    Target Date 03/05/23      PT LONG TERM GOAL #2   Title Pt will improve her L shoulder functional IR AROM to thumb to T7 spinous process to decrease difficulty donning and doffing her bra.    Baseline L shoulder functional IR AROM: Functional IR: L thumb to L 1 spinous process. (01/07/2023)    Time 8    Period Weeks    Status New    Target Date 03/05/23      PT LONG TERM GOAL #3   Title Pt will improve her L shoulder flexion AROM to 155 degrees or more  to promote ability to reach more comfortably.    Baseline L shoulder flexion AROM 140 degrees AROM (01/07/2023)    Time 8    Period Weeks    Status New    Target Date 03/05/23      PT LONG TERM GOAL #4   Title Pt will improve her L shoulder FOTO  score by at least 10 points as a demonstration of improved function.    Baseline L shoulder FOTO 63 (01/07/2023)    Time 8    Period Weeks    Status New    Target Date 03/05/23              Plan - 01/21/23 1445     Clinical Impression Statement Pt doing well in general. Conitnued to work on subluxation reduction, joint kinematics rehabilitation, strength in left rotator cuff interval and dynamic motor control of joint in funcitonal movmenets. Pt is thoughtful in her movements and follows cues with accuracy. Symptoms stable throuhgout session. Will conitnue to benefit from PT to meet goals of care.    Personal Factors and Comorbidities Comorbidity 3+;Age;Time since onset of injury/illness/exacerbation;Fitness    Comorbidities Arthritis, depression, insomnia    Examination-Activity Limitations Reach Overhead;Dressing;Hygiene/Grooming;Lift;Sleep    Stability/Clinical Decision Making Stable/Uncomplicated    Clinical Decision Making Moderate    Rehab Potential Fair    PT Frequency 2x / week    PT Duration 8 weeks    PT Treatment/Interventions Therapeutic exercise;Therapeutic activities;Neuromuscular re-education;Patient/family education;Manual techniques;Dry needling;Electrical Stimulation;Iontophoresis 4mg /ml Dexamethasone    PT Next Visit Plan scapular, rotator cuff strengthening. glenohumeral mechanics, manual techniques, modalities PRN    PT Home Exercise Plan Medbridge Access Code: [email or web address]    Consulted and Agree with Plan of Care Patient             2:43 PM, 01/21/23 Rosamaria Lints, PT, DPT Physical Therapist - [place] [phone] (Office)   01/18/2023, 1:19 PM

## 2023-01-26 ENCOUNTER — Ambulatory Visit: Payer: Medicare Other

## 2023-01-26 DIAGNOSIS — M25512 Pain in left shoulder: Secondary | ICD-10-CM | POA: Diagnosis not present

## 2023-01-26 DIAGNOSIS — M25612 Stiffness of left shoulder, not elsewhere classified: Secondary | ICD-10-CM

## 2023-01-26 DIAGNOSIS — G8929 Other chronic pain: Secondary | ICD-10-CM

## 2023-01-26 NOTE — Therapy (Signed)
OUTPATIENT PHYSICAL THERAPY TREATMENT NOTE   Patient Name: Melissa Malone MRN: [id number] DOB:1956/05/24, 67 y.o., female Today's Date: 01/26/2023  PCP: Margarita Mail, DO  REFERRING PROVIDER: Margarita Mail, DO   END OF SESSION:  PT End of Session - 01/26/23 0907     Visit Number 6    Number of Visits 17    Date for PT Re-Evaluation 03/05/23    Authorization Type [employer]primary, [employer] secondary    Progress Note Due on Visit 10    PT Start Time 0906    PT Stop Time 0946    PT Time Calculation (min) 40 min    Activity Tolerance Patient tolerated treatment well;No increased pain    Behavior During Therapy [facility] for tasks assessed/performed               Past Medical History:  Diagnosis Date   Arthritis    Depression    Eczema    Environmental allergies    Glaucoma    H/O cold sores    Hyperlipidemia    IBS (irritable bowel syndrome)    Insomnia    Macular degeneration    Over weight    Past Surgical History:  Procedure Laterality Date   BREAST EXCISIONAL BIOPSY Right 1989   neg   COLONOSCOPY WITH PROPOFOL N/A 12/19/2021   Procedure: COLONOSCOPY WITH PROPOFOL;  Surgeon: Toney Reil, MD;  Location: [facility] ENDOSCOPY;  Service: Gastroenterology;  Laterality: N/A;   DILATION AND CURETTAGE OF UTERUS     INCISION AND DRAINAGE BREAST ABSCESS     SKIN BIOPSY     Patient Active Problem List   Diagnosis Date Noted   Family history of colon cancer    Colon cancer screening    Polyp of transverse colon    Hyperlipemia 11/04/2021   Macular degeneration 11/04/2021   Environmental allergies 11/04/2021   Arthritis 11/04/2021   Glaucoma 11/04/2021   MDD (major depressive disorder) 11/04/2021   Eczema 11/04/2021   H/O cold sores 11/04/2021   Insomnia 11/04/2021   Statin intolerance 11/04/2021    REFERRING DIAG: M25.512,G89.29 (ICD-10-CM) - Chronic left shoulder pain   THERAPY DIAG:  Chronic left shoulder pain  Stiffness of left shoulder, not  elsewhere classified  Rationale for Evaluation and Treatment Rehabilitation  PERTINENT HISTORY: Chronic L shoulder pain. Sudden onset. Pt was putting an empty box up to the attic, the box fell and pt over flexed (past end range flexion) her L arm and shoulder resulting in the injury December 2023. Pain has worsened since onset. Has not yet had PT for her L shoulder. Pt is R hand dominant.   PRECAUTIONS: Latex allergies   SUBJECTIVE:   SUBJECTIVE STATEMENT: L shoulder is no pain right now. Has had intermittent pain on top of L shoulder. Had been able to reach behind to do her bra the original way.    PAIN:  Are you having pain? See subjective   TODAY'S TREATMENT:  DATE: 01/26/2023   Latex allergies Perfume No blood pressure problems per pt.  No osteoporosis     Decreased L shoulder pain with flexion with addition of L scapular retraction and posterior tipping.       Therapeutic exercise Prone on elbows                B scapular protraction 10x3 with 5 second holds  Seated L shoulder self inferior mob/glide 10x2 with 10 second holds    Standing L shoulder functional IR with PT manual   long axis joint distraction 10x3   Improve L shoulder functional IR, with pt being able to reach her bra strap.    A to P pressure to humeral head 10x3     Improve L shoulder functional IR, with pt being able to reach her bra strap.    Seated manually resisted L triceps extension isometrics with slight shoulder flexion and less than 90 degree elbow flexion 10x3 with 5 seconds   Good posterior glide of L humeral head palpated   Seated manually resisted scapular retraction targeting lower trap muscle  L 10x5 seconds     Improved exercise technique, movement at target joints, use of target muscles after mod verbal, visual, tactile cues.        Response to treatment Pt tolerated session well without aggravation of symptoms. Improved L shoulder functional IR with treatment to improve posterior and inferior glide of humeral head.      Clinical impression  Improving ability to don and doff her bra (pt goal) based on subjective reports. Continued working on improving posterior and inferior glide of humeral head to continue progress. Pt tolerated session well without aggravation of symptoms. Pt will benefit from continued skilled physical therapy services to decrease pain, improve strength, AROM, and function.      PATIENT EDUCATION: Education details: there-ex, HEP Person educated: Patient Education method: Explanation, Demonstration, Tactile cues, Verbal cues, and Handouts Education comprehension: verbalized understanding and returned demonstration  HOME EXERCISE PROGRAM: Access Code: [email or web address] URL: [email or web address] Date: 01/07/2023 Prepared by: Loralyn Freshwater   Exercises - Isometric Shoulder Internal Rotation  - 2 x daily - 7 x weekly - 3 sets - 10 reps - 5 seconds hold - Single Arm Shoulder Extension with Anchored Resistance  - 1 x daily - 7 x weekly - 3 sets - 10 reps - 5 seconds hold  - Seated Shoulder Inferior Glide  - 1 x daily - 7 x weekly - 3 sets - 10 reps - 5 seconds hold  - Scapular Retraction with Resistance  - 1 x daily - 7 x weekly - 3 sets - 10 reps - 5 seconds hold Red band  - Prone Scapular Protraction Retraction AROM on Forearms  - 1 x daily - 7 x weekly - 3 sets - 10 reps - 5 seconds hold  Standing L elbow extension isometrics resisting R hand  2x5 seconds. Reviewed and given as part of HEP 10x3 with 5 second holds per day.      PT Short Term Goals - 01/07/23 1253       PT SHORT TERM GOAL #1   Title Pt will be independent with her initial HEP to decrease pain, improve strength, ability to reach and don and doff articles of clothing more comfortably.    Baseline Pt has started her  initial HEP (01/07/2023)    Time 3    Period Weeks    Status New    Target Date 01/29/23  PT Long Term Goals - 01/07/23 1254       PT LONG TERM GOAL #1   Title Pt will have a decrease in L shoulder pain to 2/10 or less at worst to promote ability to reach as well as don and doff articles of clothing more comfortably.    Baseline 8/10 L shoulder pain at worst for the past 3 months (01/07/2023)    Time 8    Period Weeks    Status New    Target Date 03/05/23      PT LONG TERM GOAL #2   Title Pt will improve her L shoulder functional IR AROM to thumb to T7 spinous process to decrease difficulty donning and doffing her bra.    Baseline L shoulder functional IR AROM: Functional IR: L thumb to L 1 spinous process. (01/07/2023)    Time 8    Period Weeks    Status New    Target Date 03/05/23      PT LONG TERM GOAL #3   Title Pt will improve her L shoulder flexion AROM to 155 degrees or more to promote ability to reach more comfortably.    Baseline L shoulder flexion AROM 140 degrees AROM (01/07/2023)    Time 8    Period Weeks    Status New    Target Date 03/05/23      PT LONG TERM GOAL #4   Title Pt will improve her L shoulder FOTO score by at least 10 points as a demonstration of improved function.    Baseline L shoulder FOTO 63 (01/07/2023)    Time 8    Period Weeks    Status New    Target Date 03/05/23              Plan - 01/26/23 0907     Clinical Impression Statement Improving ability to don and doff her bra (pt goal) based on subjective reports. Continued working on improving posterior and inferior glide of humeral head to continue progress. Pt tolerated session well without aggravation of symptoms. Pt will benefit from continued skilled physical therapy services to decrease pain, improve strength, AROM, and function.    Personal Factors and Comorbidities Comorbidity 3+;Age;Time since onset of injury/illness/exacerbation;Fitness    Comorbidities Arthritis,  depression, insomnia    Examination-Activity Limitations Reach Overhead;Dressing;Hygiene/Grooming;Lift;Sleep    Stability/Clinical Decision Making Stable/Uncomplicated    Clinical Decision Making Low    Rehab Potential Fair    PT Frequency 2x / week    PT Duration 8 weeks    PT Treatment/Interventions Therapeutic exercise;Therapeutic activities;Neuromuscular re-education;Patient/family education;Manual techniques;Dry needling;Electrical Stimulation;Iontophoresis 4mg /ml Dexamethasone    PT Next Visit Plan scapular, rotator cuff strengthening. glenohumeral mechanics, manual techniques, modalities PRN    PT Home Exercise Plan Medbridge Access Code: [email or web address]    Consulted and Agree with Plan of Care Patient              Loralyn Freshwater PT, DPT  01/26/2023, 10:43 AM

## 2023-01-27 ENCOUNTER — Telehealth: Payer: Self-pay | Admitting: Internal Medicine

## 2023-01-27 NOTE — Telephone Encounter (Signed)
The patient called in stating she saw her provider recently and since she cannot take statin medications she did research on a medication called Nexlivet. After reading up on it she would like to proceed with getting a prescription for that. Please send to    [id number] [place], [place] - [address] Phone: [phone]  Fax: [phone]     Please assist patient further

## 2023-01-27 NOTE — Telephone Encounter (Signed)
The patient also states she spoke with her insurance [employer] also about her acyclovir ointment (ZOVIRAX) 5 % . She was told that they would cover the prescription in pill form as opposed to ointment. Please write a new script for this in pill form as well to be called into her pharmacy   [id number] [place], [place] - [address] Phone: [phone]  Fax: [phone]

## 2023-01-28 ENCOUNTER — Ambulatory Visit: Payer: Medicare Other

## 2023-01-28 DIAGNOSIS — M25612 Stiffness of left shoulder, not elsewhere classified: Secondary | ICD-10-CM

## 2023-01-28 DIAGNOSIS — M25512 Pain in left shoulder: Secondary | ICD-10-CM | POA: Diagnosis not present

## 2023-01-28 DIAGNOSIS — G8929 Other chronic pain: Secondary | ICD-10-CM

## 2023-01-28 NOTE — Therapy (Signed)
OUTPATIENT PHYSICAL THERAPY TREATMENT NOTE   Patient Name: Melissa Malone MRN: [id number] DOB:17-Mar-1956, 67 y.o., female Today's Date: 01/28/2023  PCP: Margarita Mail, DO  REFERRING PROVIDER: Margarita Mail, DO   END OF SESSION:  PT End of Session - 01/28/23 1034     Visit Number 7    Number of Visits 17    Date for PT Re-Evaluation 03/05/23    Authorization Type [employer] eprimary, [employer] secondary    Progress Note Due on Visit 10    PT Start Time 1034    PT Stop Time 1113    PT Time Calculation (min) 39 min    Activity Tolerance Patient tolerated treatment well;No increased pain    Behavior During Therapy WFL for tasks assessed/performed                Past Medical History:  Diagnosis Date   Arthritis    Depression    Eczema    Environmental allergies    Glaucoma    H/O cold sores    Hyperlipidemia    IBS (irritable bowel syndrome)    Insomnia    Macular degeneration    Over weight    Past Surgical History:  Procedure Laterality Date   BREAST EXCISIONAL BIOPSY Right 1989   neg   COLONOSCOPY WITH PROPOFOL N/A 12/19/2021   Procedure: COLONOSCOPY WITH PROPOFOL;  Surgeon: Toney Reil, MD;  Location: [facility];  Service: Gastroenterology;  Laterality: N/A;   DILATION AND CURETTAGE OF UTERUS     INCISION AND DRAINAGE BREAST ABSCESS     SKIN BIOPSY     Patient Active Problem List   Diagnosis Date Noted   Family history of colon cancer    Colon cancer screening    Polyp of transverse colon    Hyperlipemia 11/04/2021   Macular degeneration 11/04/2021   Environmental allergies 11/04/2021   Arthritis 11/04/2021   Glaucoma 11/04/2021   MDD (major depressive disorder) 11/04/2021   Eczema 11/04/2021   H/O cold sores 11/04/2021   Insomnia 11/04/2021   Statin intolerance 11/04/2021    REFERRING DIAG: M25.512,G89.29 (ICD-10-CM) - Chronic left shoulder pain   THERAPY DIAG:  Chronic left shoulder pain  Stiffness of left shoulder, not  elsewhere classified  Rationale for Evaluation and Treatment Rehabilitation  PERTINENT HISTORY: Chronic L shoulder pain. Sudden onset. Pt was putting an empty box up to the attic, the box fell and pt over flexed (past end range flexion) her L arm and shoulder resulting in the injury December 2023. Pain has worsened since onset. Has not yet had PT for her L shoulder. Pt is R hand dominant.   PRECAUTIONS: Latex allergies   SUBJECTIVE:   SUBJECTIVE STATEMENT: L shoulder has been a little bit sore (superior and posterior lateral). Has also been not having good sleep. Has been more restless in bed, and laying on it bothers her shoulder.  L shoulder was ok after last session. L shoulder was ok yesterday. 1-2/10 currently.   PAIN:  Are you having pain? See subjective   TODAY'S TREATMENT:  DATE: 01/28/2023   Latex allergies Perfume No blood pressure problems per pt.  No osteoporosis     Decreased L shoulder pain with flexion with addition of L scapular retraction and posterior tipping.       Therapeutic exercise  Seated L shoulder and triceps extension isometrics with hand on table, thumbs up  10x5 seconds for 3 sets  Improved functional IR  Standing L shoulder extension with gently reaching behind back   Red band attached on top of door.    10x3   Improved functional IR  Standing with 4 lbs wrist weight  Reaching behind back 10x3  Improved functional IR    Increased time secondary to emphasis on proper movement.     Improved exercise technique, movement at target joints, use of target muscles after mod verbal, visual, tactile cues.       Response to treatment Pt tolerated session well without aggravation of symptoms. Improved L shoulder functional IR with treatment to improve posterior and inferior glide of humeral head.       Clinical impression  Continued working on improving posterior and inferior glide of humeral head to continue progress. Pt tolerated session well without aggravation of symptoms. Improved L shoulder functional IR after session. Pt will benefit from continued skilled physical therapy services to decrease pain, improve strength, AROM, and function.      PATIENT EDUCATION: Education details: there-ex, HEP Person educated: Patient Education method: Explanation, Demonstration, Tactile cues, Verbal cues, and Handouts Education comprehension: verbalized understanding and returned demonstration  HOME EXERCISE PROGRAM: Access Code: [email or web address] URL: [email or web address] Date: 01/07/2023 Prepared by: Loralyn Freshwater   Exercises - Isometric Shoulder Internal Rotation  - 2 x daily - 7 x weekly - 3 sets - 10 reps - 5 seconds hold - Single Arm Shoulder Extension with Anchored Resistance  - 1 x daily - 7 x weekly - 3 sets - 10 reps - 5 seconds hold  - Seated Shoulder Inferior Glide  - 1 x daily - 7 x weekly - 3 sets - 10 reps - 5 seconds hold  - Scapular Retraction with Resistance  - 1 x daily - 7 x weekly - 3 sets - 10 reps - 5 seconds hold Red band  - Prone Scapular Protraction Retraction AROM on Forearms  - 1 x daily - 7 x weekly - 3 sets - 10 reps - 5 seconds hold  Standing L elbow extension isometrics resisting R hand  2x5 seconds. Reviewed and given as part of HEP 10x3 with 5 second holds per day.   - Standing Shoulder Internal Rotation AROM Behind Back  - 1 x daily - 7 x weekly - 3 sets - 10 reps   PT Short Term Goals - 01/07/23 1253       PT SHORT TERM GOAL #1   Title Pt will be independent with her initial HEP to decrease pain, improve strength, ability to reach and don and doff articles of clothing more comfortably.    Baseline Pt has started her initial HEP (01/07/2023)    Time 3    Period Weeks    Status New    Target Date 01/29/23              PT Long Term  Goals - 01/07/23 1254       PT LONG TERM GOAL #1   Title Pt will have a decrease in L shoulder pain to 2/10 or less at worst to promote ability to reach as well as  don and doff articles of clothing more comfortably.    Baseline 8/10 L shoulder pain at worst for the past 3 months (01/07/2023)    Time 8    Period Weeks    Status New    Target Date 03/05/23      PT LONG TERM GOAL #2   Title Pt will improve her L shoulder functional IR AROM to thumb to T7 spinous process to decrease difficulty donning and doffing her bra.    Baseline L shoulder functional IR AROM: Functional IR: L thumb to L 1 spinous process. (01/07/2023)    Time 8    Period Weeks    Status New    Target Date 03/05/23      PT LONG TERM GOAL #3   Title Pt will improve her L shoulder flexion AROM to 155 degrees or more to promote ability to reach more comfortably.    Baseline L shoulder flexion AROM 140 degrees AROM (01/07/2023)    Time 8    Period Weeks    Status New    Target Date 03/05/23      PT LONG TERM GOAL #4   Title Pt will improve her L shoulder FOTO score by at least 10 points as a demonstration of improved function.    Baseline L shoulder FOTO 63 (01/07/2023)    Time 8    Period Weeks    Status New    Target Date 03/05/23              Plan - 01/28/23 1033     Clinical Impression Statement Continued working on improving posterior and inferior glide of humeral head to continue progress. Pt tolerated session well without aggravation of symptoms. Improved L shoulder functional IR after session. Pt will benefit from continued skilled physical therapy services to decrease pain, improve strength, AROM, and function.    Personal Factors and Comorbidities Comorbidity 3+;Age;Time since onset of injury/illness/exacerbation;Fitness    Comorbidities Arthritis, depression, insomnia    Examination-Activity Limitations Reach Overhead;Dressing;Hygiene/Grooming;Lift;Sleep    Stability/Clinical Decision Making  Stable/Uncomplicated    Rehab Potential Fair    PT Frequency 2x / week    PT Duration 8 weeks    PT Treatment/Interventions Therapeutic exercise;Therapeutic activities;Neuromuscular re-education;Patient/family education;Manual techniques;Dry needling;Electrical Stimulation;Iontophoresis 4mg /ml Dexamethasone    PT Next Visit Plan scapular, rotator cuff strengthening. glenohumeral mechanics, manual techniques, modalities PRN    PT Home Exercise Plan Medbridge Access Code: [email or web address]    Consulted and Agree with Plan of Care Patient              Loralyn Freshwater PT, DPT  01/28/2023, 6:54 PM

## 2023-01-29 ENCOUNTER — Other Ambulatory Visit: Payer: Self-pay | Admitting: Internal Medicine

## 2023-01-31 ENCOUNTER — Other Ambulatory Visit: Payer: Self-pay | Admitting: Internal Medicine

## 2023-01-31 DIAGNOSIS — Z8619 Personal history of other infectious and parasitic diseases: Secondary | ICD-10-CM

## 2023-01-31 MED ORDER — ACYCLOVIR 400 MG PO TABS: ORAL_TABLET | ORAL | 1 refills | Status: DC

## 2023-02-02 ENCOUNTER — Ambulatory Visit: Payer: Medicare Other

## 2023-02-02 DIAGNOSIS — M25512 Pain in left shoulder: Secondary | ICD-10-CM | POA: Diagnosis not present

## 2023-02-02 DIAGNOSIS — G8929 Other chronic pain: Secondary | ICD-10-CM

## 2023-02-02 DIAGNOSIS — M25612 Stiffness of left shoulder, not elsewhere classified: Secondary | ICD-10-CM

## 2023-02-02 NOTE — Therapy (Signed)
OUTPATIENT PHYSICAL THERAPY TREATMENT NOTE   Patient Name: Melissa Malone MRN: [id number] DOB:08-Oct-1955, 67 y.o., female Today's Date: 02/02/2023  PCP: Margarita Mail, DO  REFERRING PROVIDER: Margarita Mail, DO   END OF SESSION:  PT End of Session - 02/02/23 1034     Visit Number 8    Number of Visits 17    Date for PT Re-Evaluation 03/05/23    Authorization Type [employer] eprimary, [employer] secondary    Progress Note Due on Visit 10    PT Start Time 1034    PT Stop Time 1114    PT Time Calculation (min) 40 min    Activity Tolerance Patient tolerated treatment well;No increased pain    Behavior During Therapy WFL for tasks assessed/performed                 Past Medical History:  Diagnosis Date   Arthritis    Depression    Eczema    Environmental allergies    Glaucoma    H/O cold sores    Hyperlipidemia    IBS (irritable bowel syndrome)    Insomnia    Macular degeneration    Over weight    Past Surgical History:  Procedure Laterality Date   BREAST EXCISIONAL BIOPSY Right 1989   neg   COLONOSCOPY WITH PROPOFOL N/A 12/19/2021   Procedure: COLONOSCOPY WITH PROPOFOL;  Surgeon: Toney Reil, MD;  Location: [facility];  Service: Gastroenterology;  Laterality: N/A;   DILATION AND CURETTAGE OF UTERUS     INCISION AND DRAINAGE BREAST ABSCESS     SKIN BIOPSY     Patient Active Problem List   Diagnosis Date Noted   Family history of colon cancer    Colon cancer screening    Polyp of transverse colon    Hyperlipemia 11/04/2021   Macular degeneration 11/04/2021   Environmental allergies 11/04/2021   Arthritis 11/04/2021   Glaucoma 11/04/2021   MDD (major depressive disorder) 11/04/2021   Eczema 11/04/2021   H/O cold sores 11/04/2021   Insomnia 11/04/2021   Statin intolerance 11/04/2021    REFERRING DIAG: M25.512,G89.29 (ICD-10-CM) - Chronic left shoulder pain   THERAPY DIAG:  Chronic left shoulder pain  Stiffness of left shoulder, not  elsewhere classified  Rationale for Evaluation and Treatment Rehabilitation  PERTINENT HISTORY: Chronic L shoulder pain. Sudden onset. Pt was putting an empty box up to the attic, the box fell and pt over flexed (past end range flexion) her L arm and shoulder resulting in the injury December 2023. Pain has worsened since onset. Has not yet had PT for her L shoulder. Pt is R hand dominant.   PRECAUTIONS: Latex allergies   SUBJECTIVE:   SUBJECTIVE STATEMENT: L shoulder is ok. A little sore at times. No pain currently.   PAIN:  Are you having pain? See subjective   TODAY'S TREATMENT:  DATE: 02/02/2023   Latex allergies Perfume No blood pressure problems per pt.  No osteoporosis     Decreased L shoulder pain with flexion with addition of L scapular retraction and posterior tipping.     Therapeutic exercise  Standing L shoulder AROM   Flexion 138 degrees  Functional IR: L thumb to T11 spinous process  Seated L shoulder and triceps extension isometrics with hand on table, thumbs up  10x5 seconds for 3 sets   Standing B shoulder ER yellow band 10x3  151 degrees L shoulder flexion AROM afterwards  Seated manually resisted L shoulder extension from end range flexion 10x3  Seated L scapular depression isometrics 10x3 with 5 second holds  First set with L forearm on arm rest  Last 2 sets with PT manual resistance   Seated manually resisted scapular retraction targeting lower trap muscle  L 10x5 seconds for 2 sets    Improved exercise technique, movement at target joints, use of target muscles after mod verbal, visual, tactile cues.       Response to treatment Pt tolerated session well without aggravation of symptoms.     Clinical impression   Improved L shoulder flexion AROM after improving ER muscle strength and activation. Decreased  frequency and duration of worst pain level since starting PT per pt.  Continued working on improving posterior and inferior glide of humeral head via muscle activation as well as improving scapular strength to promote better glenohumeral mechanics to decrease stress to R shoulder joint.  Pt tolerated session well without aggravation of symptoms. Pt will benefit from continued skilled physical therapy services to decrease pain, improve strength, AROM, and function.      PATIENT EDUCATION: Education details: there-ex, HEP Person educated: Patient Education method: Explanation, Demonstration, Tactile cues, Verbal cues, and Handouts Education comprehension: verbalized understanding and returned demonstration  HOME EXERCISE PROGRAM: Access Code: [email or web address] URL: [email or web address] Date: 01/07/2023 Prepared by: Loralyn Freshwater   Exercises - Isometric Shoulder Internal Rotation  - 2 x daily - 7 x weekly - 3 sets - 10 reps - 5 seconds hold - Single Arm Shoulder Extension with Anchored Resistance  - 1 x daily - 7 x weekly - 3 sets - 10 reps - 5 seconds hold  - Seated Shoulder Inferior Glide  - 1 x daily - 7 x weekly - 3 sets - 10 reps - 5 seconds hold  - Scapular Retraction with Resistance  - 1 x daily - 7 x weekly - 3 sets - 10 reps - 5 seconds hold Red band  - Prone Scapular Protraction Retraction AROM on Forearms  - 1 x daily - 7 x weekly - 3 sets - 10 reps - 5 seconds hold  Standing L elbow extension isometrics resisting R hand  2x5 seconds. Reviewed and given as part of HEP 10x3 with 5 second holds per day.   - Standing Shoulder Internal Rotation AROM Behind Back  - 1 x daily - 7 x weekly - 3 sets - 10 reps - Standing Shoulder External Rotation with Resistance  - 1 x daily - 7 x weekly - 3 sets - 10 reps   Yellow latex free band     PT Short Term Goals - 02/02/23 1035       PT SHORT TERM GOAL #1   Title Pt will be independent with her initial HEP to decrease pain,  improve strength, ability to reach and don and doff articles of clothing more comfortably.    Baseline Pt  has started her initial HEP (01/07/2023); Doing her HEP, no questions (02/02/2023)    Time 3    Period Weeks    Status Achieved    Target Date 01/29/23              PT Long Term Goals - 02/02/23 1037       PT LONG TERM GOAL #1   Title Pt will have a decrease in L shoulder pain to 2/10 or less at worst to promote ability to reach as well as don and doff articles of clothing more comfortably.    Baseline 8/10 L shoulder pain at worst for the past 3 months (01/07/2023); 7/10 at worst for the past 7 days but only very briefly. The frequency of worst pain is less since starting PT, only about 2-3 times a weeks. Pain goes away within seconds. (02/02/2023)    Time 8    Period Weeks    Status Partially Met    Target Date 03/05/23      PT LONG TERM GOAL #2   Title Pt will improve her L shoulder functional IR AROM to thumb to T7 spinous process to decrease difficulty donning and doffing her bra.    Baseline L shoulder functional IR AROM: Functional IR: L thumb to L 1 spinous process. (01/07/2023); L thumb to T11 spinous process (02/02/2023)    Time 8    Period Weeks    Status Partially Met    Target Date 03/05/23      PT LONG TERM GOAL #3   Title Pt will improve her L shoulder flexion AROM to 155 degrees or more to promote ability to reach more comfortably.    Baseline L shoulder flexion AROM 140 degrees AROM (01/07/2023); 138 degrees, 151 degrees after ER muscle strengthening (02/02/2023)    Time 8    Period Weeks    Status On-going    Target Date 03/05/23      PT LONG TERM GOAL #4   Title Pt will improve her L shoulder FOTO score by at least 10 points as a demonstration of improved function.    Baseline L shoulder FOTO 63 (01/07/2023); 65 (02/02/2023)    Time 8    Period Weeks    Status New    Target Date 03/05/23              Plan - 02/02/23 1033     Clinical Impression  Statement Improved L shoulder flexion AROM after improving ER muscle strength and activation. Decreased frequency and duration of worst pain level since starting PT per pt.  Continued working on improving posterior and inferior glide of humeral head via muscle activation as well as improving scapular strength to promote better glenohumeral mechanics to decrease stress to R shoulder joint.  Pt tolerated session well without aggravation of symptoms. Pt will benefit from continued skilled physical therapy services to decrease pain, improve strength, AROM, and function.    Personal Factors and Comorbidities Comorbidity 3+;Age;Time since onset of injury/illness/exacerbation;Fitness    Comorbidities Arthritis, depression, insomnia    Examination-Activity Limitations Reach Overhead;Dressing;Hygiene/Grooming;Lift;Sleep    Stability/Clinical Decision Making Stable/Uncomplicated    Rehab Potential Fair    PT Frequency 2x / week    PT Duration 8 weeks    PT Treatment/Interventions Therapeutic exercise;Therapeutic activities;Neuromuscular re-education;Patient/family education;Manual techniques;Dry needling;Electrical Stimulation;Iontophoresis 4mg /ml Dexamethasone    PT Next Visit Plan scapular, rotator cuff strengthening. glenohumeral mechanics, manual techniques, modalities PRN    PT Home Exercise Plan Medbridge Access Code: [email or web address]  Consulted and Agree with Plan of Care Patient              Loralyn Freshwater PT, DPT  02/02/2023, 1:14 PM

## 2023-02-04 ENCOUNTER — Ambulatory Visit: Payer: Medicare Other

## 2023-02-04 DIAGNOSIS — G8929 Other chronic pain: Secondary | ICD-10-CM

## 2023-02-04 DIAGNOSIS — M25612 Stiffness of left shoulder, not elsewhere classified: Secondary | ICD-10-CM

## 2023-02-04 DIAGNOSIS — M25512 Pain in left shoulder: Secondary | ICD-10-CM | POA: Diagnosis not present

## 2023-02-04 NOTE — Therapy (Signed)
OUTPATIENT PHYSICAL THERAPY TREATMENT NOTE   Patient Name: Melissa Malone MRN: [id number] DOB:11-Mar-1956, 67 y.o., female Today's Date: 02/04/2023  PCP: Margarita Mail, DO  REFERRING PROVIDER: Margarita Mail, DO   END OF SESSION:  PT End of Session - 02/04/23 1124     Visit Number 9    Number of Visits 17    Date for PT Re-Evaluation 03/05/23    Authorization Type [employer] eprimary, [employer] secondary    Progress Note Due on Visit 10    PT Start Time 1124    PT Stop Time 1204    PT Time Calculation (min) 40 min    Activity Tolerance Patient tolerated treatment well;No increased pain    Behavior During Therapy WFL for tasks assessed/performed                  Past Medical History:  Diagnosis Date   Arthritis    Depression    Eczema    Environmental allergies    Glaucoma    H/O cold sores    Hyperlipidemia    IBS (irritable bowel syndrome)    Insomnia    Macular degeneration    Over weight    Past Surgical History:  Procedure Laterality Date   BREAST EXCISIONAL BIOPSY Right 1989   neg   COLONOSCOPY WITH PROPOFOL N/A 12/19/2021   Procedure: COLONOSCOPY WITH PROPOFOL;  Surgeon: Toney Reil, MD;  Location: [facility];  Service: Gastroenterology;  Laterality: N/A;   DILATION AND CURETTAGE OF UTERUS     INCISION AND DRAINAGE BREAST ABSCESS     SKIN BIOPSY     Patient Active Problem List   Diagnosis Date Noted   Family history of colon cancer    Colon cancer screening    Polyp of transverse colon    Hyperlipemia 11/04/2021   Macular degeneration 11/04/2021   Environmental allergies 11/04/2021   Arthritis 11/04/2021   Glaucoma 11/04/2021   MDD (major depressive disorder) 11/04/2021   Eczema 11/04/2021   H/O cold sores 11/04/2021   Insomnia 11/04/2021   Statin intolerance 11/04/2021    REFERRING DIAG: M25.512,G89.29 (ICD-10-CM) - Chronic left shoulder pain   THERAPY DIAG:  Chronic left shoulder pain  Stiffness of left shoulder, not  elsewhere classified  Rationale for Evaluation and Treatment Rehabilitation  PERTINENT HISTORY: Chronic L shoulder pain. Sudden onset. Pt was putting an empty box up to the attic, the box fell and pt over flexed (past end range flexion) her L arm and shoulder resulting in the injury December 2023. Pain has worsened since onset. Has not yet had PT for her L shoulder. Pt is R hand dominant.   PRECAUTIONS: Latex allergies   SUBJECTIVE:   SUBJECTIVE STATEMENT: L shoulder is good. L arm was bothering her before because she was moving furniture prior to last session (she recently remembered). Returned to the 5 lbs weight for her shoulder exercise and was fine. No L shoulder pain currently.   PAIN:  Are you having pain? See subjective   TODAY'S TREATMENT:  DATE: 02/04/2023   Latex allergies Perfume No blood pressure problems per pt.  No osteoporosis     Decreased L shoulder pain with flexion with addition of L scapular retraction and posterior tipping.     Therapeutic exercise  Standing L shoulder AROM   Flexion 137 degrees  Standing B shoulder ER yellow band 10x3  147 degrees L shoulder flexion AROM afterwards  Standing L shoulder extension red band attached to top of door 10x3  With pt increasing shoulder flexion AROM when returning to starting position   153 degrees L shoulder flexion AROM afterwards   Seated L shoulder and triceps extension isometrics with hand on table, thumbs up  10x10 seconds  Seated L scapular depression isometrics 10x with 5 second holds with PT manual resistance   L upper trap muscle tension palpated  Seated manually resisted scapular retraction targeting lower trap muscle  L 10x5 seconds for 3 sets  Standing shoulder PNF D2 flex   L 10x3 with emphasis on scapular retraction and posterior tipping     Improved  exercise technique, movement at target joints, use of target muscles after mod verbal, visual, tactile cues.       Response to treatment Fair tolerance to today's session      Clinical impression  Continued working on improving posterior and inferior glide of humeral head via muscle activation as well as improving scapular strength to promote better glenohumeral mechanics to decrease stress to R shoulder joint. Fair tolerance to today's session. Pt will benefit from continued skilled physical therapy services to decrease pain, improve strength, AROM, and function.      PATIENT EDUCATION: Education details: there-ex, HEP Person educated: Patient Education method: Explanation, Demonstration, Tactile cues, Verbal cues, and Handouts Education comprehension: verbalized understanding and returned demonstration  HOME EXERCISE PROGRAM: Access Code: [email or web address] URL: [email or web address] Date: 01/07/2023 Prepared by: Loralyn Freshwater   Exercises - Isometric Shoulder Internal Rotation  - 2 x daily - 7 x weekly - 3 sets - 10 reps - 5 seconds hold - Single Arm Shoulder Extension with Anchored Resistance  - 1 x daily - 7 x weekly - 3 sets - 10 reps - 5 seconds hold  Yellow band    - Seated Shoulder Inferior Glide  - 1 x daily - 7 x weekly - 3 sets - 10 reps - 5 seconds hold  - Scapular Retraction with Resistance  - 1 x daily - 7 x weekly - 3 sets - 10 reps - 5 seconds hold Red band  - Prone Scapular Protraction Retraction AROM on Forearms  - 1 x daily - 7 x weekly - 3 sets - 10 reps - 5 seconds hold  Standing L elbow extension isometrics resisting R hand  2x5 seconds. Reviewed and given as part of HEP 10x3 with 5 second holds per day.   - Standing Shoulder Internal Rotation AROM Behind Back  - 1 x daily - 7 x weekly - 3 sets - 10 reps - Standing Shoulder External Rotation with Resistance  - 1 x daily - 7 x weekly - 3 sets - 10 reps   Yellow latex free band     PT Short  Term Goals - 02/02/23 1035       PT SHORT TERM GOAL #1   Title Pt will be independent with her initial HEP to decrease pain, improve strength, ability to reach and don and doff articles of clothing more comfortably.    Baseline Pt has started her initial HEP (  01/07/2023); Doing her HEP, no questions (02/02/2023)    Time 3    Period Weeks    Status Achieved    Target Date 01/29/23              PT Long Term Goals - 02/02/23 1037       PT LONG TERM GOAL #1   Title Pt will have a decrease in L shoulder pain to 2/10 or less at worst to promote ability to reach as well as don and doff articles of clothing more comfortably.    Baseline 8/10 L shoulder pain at worst for the past 3 months (01/07/2023); 7/10 at worst for the past 7 days but only very briefly. The frequency of worst pain is less since starting PT, only about 2-3 times a weeks. Pain goes away within seconds. (02/02/2023)    Time 8    Period Weeks    Status Partially Met    Target Date 03/05/23      PT LONG TERM GOAL #2   Title Pt will improve her L shoulder functional IR AROM to thumb to T7 spinous process to decrease difficulty donning and doffing her bra.    Baseline L shoulder functional IR AROM: Functional IR: L thumb to L 1 spinous process. (01/07/2023); L thumb to T11 spinous process (02/02/2023)    Time 8    Period Weeks    Status Partially Met    Target Date 03/05/23      PT LONG TERM GOAL #3   Title Pt will improve her L shoulder flexion AROM to 155 degrees or more to promote ability to reach more comfortably.    Baseline L shoulder flexion AROM 140 degrees AROM (01/07/2023); 138 degrees, 151 degrees after ER muscle strengthening (02/02/2023)    Time 8    Period Weeks    Status On-going    Target Date 03/05/23      PT LONG TERM GOAL #4   Title Pt will improve her L shoulder FOTO score by at least 10 points as a demonstration of improved function.    Baseline L shoulder FOTO 63 (01/07/2023); 65 (02/02/2023)    Time 8     Period Weeks    Status New    Target Date 03/05/23              Plan - 02/04/23 1124     Clinical Impression Statement Continued working on improving posterior and inferior glide of humeral head via muscle activation as well as improving scapular strength to promote better glenohumeral mechanics to decrease stress to R shoulder joint. Fair tolerance to today's session. Pt will benefit from continued skilled physical therapy services to decrease pain, improve strength, AROM, and function.    Personal Factors and Comorbidities Comorbidity 3+;Age;Time since onset of injury/illness/exacerbation;Fitness    Comorbidities Arthritis, depression, insomnia    Examination-Activity Limitations Reach Overhead;Dressing;Hygiene/Grooming;Lift;Sleep    Stability/Clinical Decision Making Stable/Uncomplicated    Rehab Potential Fair    PT Frequency 2x / week    PT Duration 8 weeks    PT Treatment/Interventions Therapeutic exercise;Therapeutic activities;Neuromuscular re-education;Patient/family education;Manual techniques;Dry needling;Electrical Stimulation;Iontophoresis 4mg /ml Dexamethasone    PT Next Visit Plan scapular, rotator cuff strengthening. glenohumeral mechanics, manual techniques, modalities PRN    PT Home Exercise Plan Medbridge Access Code: [email or web address]    Consulted and Agree with Plan of Care Patient              Loralyn Freshwater PT, DPT  02/04/2023, 12:43 PM

## 2023-02-08 ENCOUNTER — Ambulatory Visit: Payer: Medicare Other

## 2023-02-08 DIAGNOSIS — M25512 Pain in left shoulder: Secondary | ICD-10-CM | POA: Diagnosis not present

## 2023-02-08 DIAGNOSIS — G8929 Other chronic pain: Secondary | ICD-10-CM

## 2023-02-08 DIAGNOSIS — M25612 Stiffness of left shoulder, not elsewhere classified: Secondary | ICD-10-CM

## 2023-02-08 NOTE — Therapy (Signed)
OUTPATIENT PHYSICAL THERAPY TREATMENT NOTE And Progress Report (01/07/2023 - 02/08/2023)   Patient Name: Melissa Malone MRN: [id number] DOB:30-May-1956, 67 y.o., female Today's Date: 02/08/2023  PCP: Margarita Mail, DO  REFERRING PROVIDER: Margarita Mail, DO   END OF SESSION:  PT End of Session - 02/08/23 1119     Visit Number 10    Number of Visits 17    Date for PT Re-Evaluation 03/05/23    Authorization Type [employer] eprimary, [employer] secondary    Progress Note Due on Visit 10    PT Start Time 1119    PT Stop Time 1205    PT Time Calculation (min) 46 min    Activity Tolerance Patient tolerated treatment well;No increased pain    Behavior During Therapy WFL for tasks assessed/performed                   Past Medical History:  Diagnosis Date   Arthritis    Depression    Eczema    Environmental allergies    Glaucoma    H/O cold sores    Hyperlipidemia    IBS (irritable bowel syndrome)    Insomnia    Macular degeneration    Over weight    Past Surgical History:  Procedure Laterality Date   BREAST EXCISIONAL BIOPSY Right 1989   neg   COLONOSCOPY WITH PROPOFOL N/A 12/19/2021   Procedure: COLONOSCOPY WITH PROPOFOL;  Surgeon: Toney Reil, MD;  Location: [facility];  Service: Gastroenterology;  Laterality: N/A;   DILATION AND CURETTAGE OF UTERUS     INCISION AND DRAINAGE BREAST ABSCESS     SKIN BIOPSY     Patient Active Problem List   Diagnosis Date Noted   Family history of colon cancer    Colon cancer screening    Polyp of transverse colon    Hyperlipemia 11/04/2021   Macular degeneration 11/04/2021   Environmental allergies 11/04/2021   Arthritis 11/04/2021   Glaucoma 11/04/2021   MDD (major depressive disorder) 11/04/2021   Eczema 11/04/2021   H/O cold sores 11/04/2021   Insomnia 11/04/2021   Statin intolerance 11/04/2021    REFERRING DIAG: M25.512,G89.29 (ICD-10-CM) - Chronic left shoulder pain   THERAPY DIAG:  Chronic  left shoulder pain  Stiffness of left shoulder, not elsewhere classified  Rationale for Evaluation and Treatment Rehabilitation  PERTINENT HISTORY: Chronic L shoulder pain. Sudden onset. Pt was putting an empty box up to the attic, the box fell and pt over flexed (past end range flexion) her L arm and shoulder resulting in the injury December 2023. Pain has worsened since onset. Has not yet had PT for her L shoulder. Pt is R hand dominant.   PRECAUTIONS: Latex allergies   SUBJECTIVE:   SUBJECTIVE STATEMENT: L shoulder is good. No pain currently (at rest). 1/10 L superior shoulder pain with shoulder flexion  PAIN:  Are you having pain? See subjective   TODAY'S TREATMENT:  DATE: 02/08/2023   Latex allergies Perfume No blood pressure problems per pt.  No osteoporosis     Decreased L shoulder pain with flexion with addition of L scapular retraction and posterior tipping.     Therapeutic exercise  Standing L shoulder AROM   Flexion 151 degrees  Functional IR: L thumb to T11 spinous process  Reviewed progress with PT towards goals  Seated manually resisted scapular retraction targeting lower trap muscle  L 10x5 seconds for 3 sets  Seated manually resisted L shoulder extension from end range flexion 10x3  Standing B shoulder ER red (upgrade) band 10x3  Seated L shoulder and triceps extension isometrics with hand on table, thumbs up  10x10 seconds  Standing with L shoulder in flexion (comfortable end range)  shoulder and triceps extension isometrics 10x3 with 5 second holds  Standing L shoulder functional IR with PT manual                              A to P pressure to humeral head 10x3   Improved exercise technique, movement at target joints, use of target muscles after mod verbal, visual, tactile cues.       Response to  treatment Fair tolerance to today's session      Clinical impression Pt demonstrates decreased L shoulder pain level at worst as well as frequency and duration, improved L shoulder flexion and functional IR AROM since initial evaluation. Pt making progress with PT towards goals.  Continued working on improving posterior and inferior glide of humeral head via muscle activation as well as improving scapular strength to promote better glenohumeral mechanics to decrease stress to R shoulder joint. Fair tolerance to today's session. Pt will benefit from continued skilled physical therapy services to decrease pain, improve strength, AROM, and function.      PATIENT EDUCATION: Education details: there-ex, HEP Person educated: Patient Education method: Explanation, Demonstration, Tactile cues, Verbal cues, and Handouts Education comprehension: verbalized understanding and returned demonstration  HOME EXERCISE PROGRAM: Access Code: [email or web address] URL: [email or web address] Date: 01/07/2023 Prepared by: Loralyn Freshwater   Exercises - Isometric Shoulder Internal Rotation  - 2 x daily - 7 x weekly - 3 sets - 10 reps - 5 seconds hold - Single Arm Shoulder Extension with Anchored Resistance  - 1 x daily - 7 x weekly - 3 sets - 10 reps - 5 seconds hold  Yellow band    - Seated Shoulder Inferior Glide  - 1 x daily - 7 x weekly - 3 sets - 10 reps - 5 seconds hold  - Scapular Retraction with Resistance  - 1 x daily - 7 x weekly - 3 sets - 10 reps - 5 seconds hold Red band  - Prone Scapular Protraction Retraction AROM on Forearms  - 1 x daily - 7 x weekly - 3 sets - 10 reps - 5 seconds hold  Standing L elbow extension isometrics resisting R hand  2x5 seconds. Reviewed and given as part of HEP 10x3 with 5 second holds per day.   - Standing Shoulder Internal Rotation AROM Behind Back  - 1 x daily - 7 x weekly - 3 sets - 10 reps - Standing Shoulder External Rotation with Resistance  - 1 x  daily - 7 x weekly - 3 sets - 10 reps   Yellow latex free band     PT Short Term Goals - 02/02/23 1035  PT SHORT TERM GOAL #1   Title Pt will be independent with her initial HEP to decrease pain, improve strength, ability to reach and don and doff articles of clothing more comfortably.    Baseline Pt has started her initial HEP (01/07/2023); Doing her HEP, no questions (02/02/2023)    Time 3    Period Weeks    Status Achieved    Target Date 01/29/23              PT Long Term Goals - 02/08/23 1120       PT LONG TERM GOAL #1   Title Pt will have a decrease in L shoulder pain to 2/10 or less at worst to promote ability to reach as well as don and doff articles of clothing more comfortably.    Baseline 8/10 L shoulder pain at worst for the past 3 months (01/07/2023); 7/10 at worst for the past 7 days but only very briefly. The frequency of worst pain is less since starting PT, only about 2-3 times a weeks. Pain goes away within seconds. (02/02/2023); 5/10 at worst for the past 7 days (02/08/2023)    Time 8    Period Weeks    Status Partially Met    Target Date 03/05/23      PT LONG TERM GOAL #2   Title Pt will improve her L shoulder functional IR AROM to thumb to T7 spinous process to decrease difficulty donning and doffing her bra.    Baseline L shoulder functional IR AROM: Functional IR: L thumb to L 1 spinous process. (01/07/2023); L thumb to T11 spinous process (02/02/2023); L thumb to T11 spinous process (02/08/2023)    Time 8    Period Weeks    Status Partially Met    Target Date 03/05/23      PT LONG TERM GOAL #3   Title Pt will improve her L shoulder flexion AROM to 155 degrees or more to promote ability to reach more comfortably.    Baseline L shoulder flexion AROM 140 degrees AROM (01/07/2023); 138 degrees, 151 degrees after ER muscle strengthening (02/02/2023); 151 degrees at start of session (02/08/2023)    Time 8    Period Weeks    Status Partially Met    Target  Date 03/05/23      PT LONG TERM GOAL #4   Title Pt will improve her L shoulder FOTO score by at least 10 points as a demonstration of improved function.    Baseline L shoulder FOTO 63 (01/07/2023); 65 (02/02/2023)    Time 8    Period Weeks    Status On-going    Target Date 03/05/23              Plan - 02/08/23 1118     Clinical Impression Statement Pt demonstrates decreased L shoulder pain level at worst as well as frequency and duration, improved L shoulder flexion and functional IR AROM since initial evaluation. Pt making progress with PT towards goals.  Continued working on improving posterior and inferior glide of humeral head via muscle activation as well as improving scapular strength to promote better glenohumeral mechanics to decrease stress to R shoulder joint. Fair tolerance to today's session. Pt will benefit from continued skilled physical therapy services to decrease pain, improve strength, AROM, and function.    Personal Factors and Comorbidities Comorbidity 3+;Age;Time since onset of injury/illness/exacerbation;Fitness    Comorbidities Arthritis, depression, insomnia    Examination-Activity Limitations Reach Overhead;Dressing;Hygiene/Grooming;Lift;Sleep    Stability/Clinical Decision  Making Stable/Uncomplicated    Rehab Potential Fair    PT Frequency 2x / week    PT Duration 8 weeks    PT Treatment/Interventions Therapeutic exercise;Therapeutic activities;Neuromuscular re-education;Patient/family education;Manual techniques;Dry needling;Electrical Stimulation;Iontophoresis 4mg /ml Dexamethasone    PT Next Visit Plan scapular, rotator cuff strengthening. glenohumeral mechanics, manual techniques, modalities PRN    PT Home Exercise Plan Medbridge Access Code: [email or web address]    Consulted and Agree with Plan of Care Patient             Thank you for your referral.   Loralyn Freshwater PT, DPT  02/08/2023, 12:46 PM

## 2023-02-11 ENCOUNTER — Ambulatory Visit: Payer: Medicare Other

## 2023-02-11 DIAGNOSIS — M25512 Pain in left shoulder: Secondary | ICD-10-CM | POA: Diagnosis not present

## 2023-02-11 DIAGNOSIS — M25612 Stiffness of left shoulder, not elsewhere classified: Secondary | ICD-10-CM

## 2023-02-11 DIAGNOSIS — G8929 Other chronic pain: Secondary | ICD-10-CM

## 2023-02-11 NOTE — Therapy (Signed)
OUTPATIENT PHYSICAL THERAPY TREATMENT NOTE    Patient Name: Melissa Malone MRN: [id number] DOB:[birth year], 67 y.o., female Today's Date: 02/11/2023  PCP: Margarita Mail, DO  REFERRING PROVIDER: Margarita Mail, DO   END OF SESSION:  PT End of Session - 02/11/23 1120     Visit Number 11    Number of Visits 17    Date for PT Re-Evaluation 03/05/23    Authorization Type [employer] eprimary, [employer] secondary    Progress Note Due on Visit 10    PT Start Time 1120    PT Stop Time 1201    PT Time Calculation (min) 41 min    Activity Tolerance Patient tolerated treatment well    Behavior During Therapy WFL for tasks assessed/performed                    Past Medical History:  Diagnosis Date   Arthritis    Depression    Eczema    Environmental allergies    Glaucoma    H/O cold sores    Hyperlipidemia    IBS (irritable bowel syndrome)    Insomnia    Macular degeneration    Over weight    Past Surgical History:  Procedure Laterality Date   BREAST EXCISIONAL BIOPSY Right 1989   neg   COLONOSCOPY WITH PROPOFOL N/A 12/19/2021   Procedure: COLONOSCOPY WITH PROPOFOL;  Surgeon: Toney Reil, MD;  Location: [facility];  Service: Gastroenterology;  Laterality: N/A;   DILATION AND CURETTAGE OF UTERUS     INCISION AND DRAINAGE BREAST ABSCESS     SKIN BIOPSY     Patient Active Problem List   Diagnosis Date Noted   Family history of colon cancer    Colon cancer screening    Polyp of transverse colon    Hyperlipemia 11/04/2021   Macular degeneration 11/04/2021   Environmental allergies 11/04/2021   Arthritis 11/04/2021   Glaucoma 11/04/2021   MDD (major depressive disorder) 11/04/2021   Eczema 11/04/2021   H/O cold sores 11/04/2021   Insomnia 11/04/2021   Statin intolerance 11/04/2021    REFERRING DIAG: M25.512,G89.29 (ICD-10-CM) - Chronic left shoulder pain   THERAPY DIAG:  Chronic left shoulder pain  Stiffness of left shoulder, not elsewhere  classified  Rationale for Evaluation and Treatment Rehabilitation  PERTINENT HISTORY: Chronic L shoulder pain. Sudden onset. Pt was putting an empty box up to the attic, the box fell and pt over flexed (past end range flexion) her L arm and shoulder resulting in the injury December 2023. Pain has worsened since onset. Has not yet had PT for her L shoulder. Pt is R hand dominant.   PRECAUTIONS: Latex allergies   SUBJECTIVE:   SUBJECTIVE STATEMENT: L shoulder has a little bit more sore. Helped her friend clean some windows. Does not hurt right now. Reaching behind her back was harder this morning. Dog has also been waking her up and her sleep is not as good. Might have also been laying on her L shoulder.    PAIN:  Are you having pain? See subjective   TODAY'S TREATMENT:  DATE: 02/11/2023   Latex allergies Perfume No blood pressure problems per pt.  No osteoporosis     Decreased L shoulder pain with flexion with addition of L scapular retraction and posterior tipping.     Therapeutic exercise   Standing with L shoulder in flexion at wall (comfortable end range)  shoulder and triceps extension isometrics 10x3 with 5 second holds  Wall push ups 5x, 8x, 10x to promote triceps strength and posterior glide of humeral head   Improved functional IR   OMEGA machine   Rows plate 10 for 16X0   B shoulder extension in standing, plate 10  for 5x3  L shoulder flexion AROM. Superior shoulder pain at end range  Good mobility to distal clavicle palpated.   Seated manually resisted scapular retraction targeting lower trap muscle  L 10x5 seconds for 3 sets  Seated manually resisted L shoulder extension from end range flexion 10x2    L shoulder feels better at end range flexion reported.      Improved exercise technique, movement at target joints,  use of target muscles after mod verbal, visual, tactile cues.             Response to treatment L shoulder feels better after session reported.      Clinical impression Continued working on improving posterior and inferior glide of humeral head via muscle activation as well as improving scapular strength to promote better glenohumeral mechanics to decrease stress to R shoulder joint. Improved L shoulder comfort level reported after session. Pt will benefit from continued skilled physical therapy services to decrease pain, improve strength, AROM, and function.      PATIENT EDUCATION: Education details: there-ex, HEP Person educated: Patient Education method: Explanation, Demonstration, Tactile cues, Verbal cues, and Handouts Education comprehension: verbalized understanding and returned demonstration  HOME EXERCISE PROGRAM: Access Code: [email or web address] URL: [email or web address] Date: 01/07/2023 Prepared by: Loralyn Freshwater   Exercises - Isometric Shoulder Internal Rotation  - 2 x daily - 7 x weekly - 3 sets - 10 reps - 5 seconds hold - Single Arm Shoulder Extension with Anchored Resistance  - 1 x daily - 7 x weekly - 3 sets - 10 reps - 5 seconds hold  Yellow band    - Seated Shoulder Inferior Glide  - 1 x daily - 7 x weekly - 3 sets - 10 reps - 5 seconds hold  - Scapular Retraction with Resistance  - 1 x daily - 7 x weekly - 3 sets - 10 reps - 5 seconds hold Red band  - Prone Scapular Protraction Retraction AROM on Forearms  - 1 x daily - 7 x weekly - 3 sets - 10 reps - 5 seconds hold  Standing L elbow extension isometrics resisting R hand  2x5 seconds. Reviewed and given as part of HEP 10x3 with 5 second holds per day.   - Standing Shoulder Internal Rotation AROM Behind Back  - 1 x daily - 7 x weekly - 3 sets - 10 reps - Standing Shoulder External Rotation with Resistance  - 1 x daily - 7 x weekly - 3 sets - 10 reps   Yellow latex free band     PT Short  Term Goals - 02/02/23 1035       PT SHORT TERM GOAL #1   Title Pt will be independent with her initial HEP to decrease pain, improve strength, ability to reach and don and doff articles of clothing more comfortably.    Baseline Pt has  started her initial HEP (01/07/2023); Doing her HEP, no questions (02/02/2023)    Time 3    Period Weeks    Status Achieved    Target Date 01/29/23              PT Long Term Goals - 02/08/23 1120       PT LONG TERM GOAL #1   Title Pt will have a decrease in L shoulder pain to 2/10 or less at worst to promote ability to reach as well as don and doff articles of clothing more comfortably.    Baseline 8/10 L shoulder pain at worst for the past 3 months (01/07/2023); 7/10 at worst for the past 7 days but only very briefly. The frequency of worst pain is less since starting PT, only about 2-3 times a weeks. Pain goes away within seconds. (02/02/2023); 5/10 at worst for the past 7 days (02/08/2023)    Time 8    Period Weeks    Status Partially Met    Target Date 03/05/23      PT LONG TERM GOAL #2   Title Pt will improve her L shoulder functional IR AROM to thumb to T7 spinous process to decrease difficulty donning and doffing her bra.    Baseline L shoulder functional IR AROM: Functional IR: L thumb to L 1 spinous process. (01/07/2023); L thumb to T11 spinous process (02/02/2023); L thumb to T11 spinous process (02/08/2023)    Time 8    Period Weeks    Status Partially Met    Target Date 03/05/23      PT LONG TERM GOAL #3   Title Pt will improve her L shoulder flexion AROM to 155 degrees or more to promote ability to reach more comfortably.    Baseline L shoulder flexion AROM 140 degrees AROM (01/07/2023); 138 degrees, 151 degrees after ER muscle strengthening (02/02/2023); 151 degrees at start of session (02/08/2023)    Time 8    Period Weeks    Status Partially Met    Target Date 03/05/23      PT LONG TERM GOAL #4   Title Pt will improve her L shoulder  FOTO score by at least 10 points as a demonstration of improved function.    Baseline L shoulder FOTO 63 (01/07/2023); 65 (02/02/2023)    Time 8    Period Weeks    Status On-going    Target Date 03/05/23              Plan - 02/11/23 1120     Clinical Impression Statement Continued working on improving posterior and inferior glide of humeral head via muscle activation as well as improving scapular strength to promote better glenohumeral mechanics to decrease stress to R shoulder joint. Improved L shoulder comfort level reported after session. Pt will benefit from continued skilled physical therapy services to decrease pain, improve strength, AROM, and function.    Personal Factors and Comorbidities Comorbidity 3+;Age;Time since onset of injury/illness/exacerbation;Fitness    Comorbidities Arthritis, depression, insomnia    Examination-Activity Limitations Reach Overhead;Dressing;Hygiene/Grooming;Lift;Sleep    Stability/Clinical Decision Making Stable/Uncomplicated    Clinical Decision Making Low    Rehab Potential Fair    PT Frequency 2x / week    PT Duration 8 weeks    PT Treatment/Interventions Therapeutic exercise;Therapeutic activities;Neuromuscular re-education;Patient/family education;Manual techniques;Dry needling;Electrical Stimulation;Iontophoresis 4mg /ml Dexamethasone    PT Next Visit Plan scapular, rotator cuff strengthening. glenohumeral mechanics, manual techniques, modalities PRN    PT Home Exercise Plan [employer]  Access Code: [email or web address]    Consulted and Agree with Plan of Care Patient               Loralyn Freshwater PT, DPT  02/11/2023, 12:08 PM

## 2023-02-16 ENCOUNTER — Ambulatory Visit: Payer: Medicare Other

## 2023-02-16 DIAGNOSIS — G8929 Other chronic pain: Secondary | ICD-10-CM

## 2023-02-16 DIAGNOSIS — M25612 Stiffness of left shoulder, not elsewhere classified: Secondary | ICD-10-CM

## 2023-02-16 DIAGNOSIS — M25512 Pain in left shoulder: Secondary | ICD-10-CM | POA: Diagnosis not present

## 2023-02-16 NOTE — Therapy (Signed)
OUTPATIENT PHYSICAL THERAPY TREATMENT NOTE    Patient Name: Melissa Malone MRN: [id number] DOB:1956-03-13, 67 y.o., female Today's Date: 02/16/2023  PCP: Margarita Mail, DO  REFERRING PROVIDER: Margarita Mail, DO   END OF SESSION:  PT End of Session - 02/16/23 1302     Visit Number 12    Number of Visits 17    Date for PT Re-Evaluation 03/05/23    Authorization Type [employer] eprimary, [employer] secondary    Progress Note Due on Visit 20    PT Start Time 1302    PT Stop Time 1343    PT Time Calculation (min) 41 min    Activity Tolerance Patient tolerated treatment well    Behavior During Therapy WFL for tasks assessed/performed                     Past Medical History:  Diagnosis Date   Arthritis    Depression    Eczema    Environmental allergies    Glaucoma    H/O cold sores    Hyperlipidemia    IBS (irritable bowel syndrome)    Insomnia    Macular degeneration    Over weight    Past Surgical History:  Procedure Laterality Date   BREAST EXCISIONAL BIOPSY Right 1989   neg   COLONOSCOPY WITH PROPOFOL N/A 12/19/2021   Procedure: COLONOSCOPY WITH PROPOFOL;  Surgeon: Toney Reil, MD;  Location: [facility];  Service: Gastroenterology;  Laterality: N/A;   DILATION AND CURETTAGE OF UTERUS     INCISION AND DRAINAGE BREAST ABSCESS     SKIN BIOPSY     Patient Active Problem List   Diagnosis Date Noted   Family history of colon cancer    Colon cancer screening    Polyp of transverse colon    Hyperlipemia 11/04/2021   Macular degeneration 11/04/2021   Environmental allergies 11/04/2021   Arthritis 11/04/2021   Glaucoma 11/04/2021   MDD (major depressive disorder) 11/04/2021   Eczema 11/04/2021   H/O cold sores 11/04/2021   Insomnia 11/04/2021   Statin intolerance 11/04/2021    REFERRING DIAG: M25.512,G89.29 (ICD-10-CM) - Chronic left shoulder pain   THERAPY DIAG:  Chronic left shoulder pain  Stiffness of left shoulder, not  elsewhere classified  Rationale for Evaluation and Treatment Rehabilitation  PERTINENT HISTORY: Chronic L shoulder pain. Sudden onset. Pt was putting an empty box up to the attic, the box fell and pt over flexed (past end range flexion) her L arm and shoulder resulting in the injury December 2023. Pain has worsened since onset. Has not yet had PT for her L shoulder. Pt is R hand dominant.   PRECAUTIONS: Latex allergies   SUBJECTIVE:   SUBJECTIVE STATEMENT: L shoulder is good. No pain currently.    PAIN:  Are you having pain? See subjective   TODAY'S TREATMENT:  DATE: 02/16/2023   Latex allergies Perfume No blood pressure problems per pt.  No osteoporosis     Decreased L shoulder pain with flexion with addition of L scapular retraction and posterior tipping.     Therapeutic exercise  Standing L shoulder flexion AROM   140 degrees at start of session.   Standing with L shoulder in flexion at wall (comfortable end range)  shoulder and triceps extension isometrics 10x3 with 5 second holds  152 degrees L shoulder flexion AROM and with less discomfort at end range afterwards    Wall push ups 10x3 to promote triceps strength and posterior glide of humeral head  Supine shoulder flexion to end range 2 lbs   L 10x2 slowly   Supine with L shoulder in about 120 degrees flexion   Triceps extension 2 lbs    L 10x2  Seated L shoulder self inferior glide 10x10 seconds   Sitting with L arm propped into about 100 degrees flexion   Triceps extension resisting red band 10x   Green band 10x2   155 degrees L shoulder flexion AROM after session.     Improved exercise technique, movement at target joints, use of target muscles after mod verbal, visual, tactile cues.     Response to treatment Improved L shoulder flexion AROM and comfort at end  range after session.       Clinical impression Continued working on improving posterior and inferior glide of humeral head via muscle activation to promote better glenohumeral mechanics to decrease stress to R shoulder joint.  Improved L shoulder flexion AROM and comfort at end range after session.  Pt will benefit from continued skilled physical therapy services to decrease pain, improve strength, AROM, and function.       PATIENT EDUCATION: Education details: there-ex, HEP Person educated: Patient Education method: Explanation, Demonstration, Tactile cues, Verbal cues, and Handouts Education comprehension: verbalized understanding and returned demonstration  HOME EXERCISE PROGRAM: Access Code: [email or web address] URL: [email or web address] Date: 01/07/2023 Prepared by: Loralyn Freshwater   Exercises - Isometric Shoulder Internal Rotation  - 2 x daily - 7 x weekly - 3 sets - 10 reps - 5 seconds hold - Single Arm Shoulder Extension with Anchored Resistance  - 1 x daily - 7 x weekly - 3 sets - 10 reps - 5 seconds hold  Yellow band    - Seated Shoulder Inferior Glide  - 1 x daily - 7 x weekly - 3 sets - 10 reps - 5 seconds hold  - Scapular Retraction with Resistance  - 1 x daily - 7 x weekly - 3 sets - 10 reps - 5 seconds hold Red band  - Prone Scapular Protraction Retraction AROM on Forearms  - 1 x daily - 7 x weekly - 3 sets - 10 reps - 5 seconds hold  Standing L elbow extension isometrics resisting R hand  2x5 seconds. Reviewed and given as part of HEP 10x3 with 5 second holds per day.   - Standing Shoulder Internal Rotation AROM Behind Back  - 1 x daily - 7 x weekly - 3 sets - 10 reps - Standing Shoulder External Rotation with Resistance  - 1 x daily - 7 x weekly - 3 sets - 10 reps   Yellow latex free band     PT Short Term Goals - 02/02/23 1035       PT SHORT TERM GOAL #1   Title Pt will be independent with her initial HEP to decrease pain, improve strength,  ability  to reach and don and doff articles of clothing more comfortably.    Baseline Pt has started her initial HEP (01/07/2023); Doing her HEP, no questions (02/02/2023)    Time 3    Period Weeks    Status Achieved    Target Date 01/29/23              PT Long Term Goals - 02/08/23 1120       PT LONG TERM GOAL #1   Title Pt will have a decrease in L shoulder pain to 2/10 or less at worst to promote ability to reach as well as don and doff articles of clothing more comfortably.    Baseline 8/10 L shoulder pain at worst for the past 3 months (01/07/2023); 7/10 at worst for the past 7 days but only very briefly. The frequency of worst pain is less since starting PT, only about 2-3 times a weeks. Pain goes away within seconds. (02/02/2023); 5/10 at worst for the past 7 days (02/08/2023)    Time 8    Period Weeks    Status Partially Met    Target Date 03/05/23      PT LONG TERM GOAL #2   Title Pt will improve her L shoulder functional IR AROM to thumb to T7 spinous process to decrease difficulty donning and doffing her bra.    Baseline L shoulder functional IR AROM: Functional IR: L thumb to L 1 spinous process. (01/07/2023); L thumb to T11 spinous process (02/02/2023); L thumb to T11 spinous process (02/08/2023)    Time 8    Period Weeks    Status Partially Met    Target Date 03/05/23      PT LONG TERM GOAL #3   Title Pt will improve her L shoulder flexion AROM to 155 degrees or more to promote ability to reach more comfortably.    Baseline L shoulder flexion AROM 140 degrees AROM (01/07/2023); 138 degrees, 151 degrees after ER muscle strengthening (02/02/2023); 151 degrees at start of session (02/08/2023)    Time 8    Period Weeks    Status Partially Met    Target Date 03/05/23      PT LONG TERM GOAL #4   Title Pt will improve her L shoulder FOTO score by at least 10 points as a demonstration of improved function.    Baseline L shoulder FOTO 63 (01/07/2023); 65 (02/02/2023)    Time 8    Period  Weeks    Status On-going    Target Date 03/05/23              Plan - 02/16/23 1300     Clinical Impression Statement Continued working on improving posterior and inferior glide of humeral head via muscle activation to promote better glenohumeral mechanics to decrease stress to R shoulder joint.  Improved L shoulder flexion AROM and comfort at end range after session.  Pt will benefit from continued skilled physical therapy services to decrease pain, improve strength, AROM, and function.    Personal Factors and Comorbidities Comorbidity 3+;Age;Time since onset of injury/illness/exacerbation;Fitness    Comorbidities Arthritis, depression, insomnia    Examination-Activity Limitations Reach Overhead;Dressing;Hygiene/Grooming;Lift;Sleep    Stability/Clinical Decision Making Stable/Uncomplicated    Rehab Potential Fair    PT Frequency 2x / week    PT Duration 8 weeks    PT Treatment/Interventions Therapeutic exercise;Therapeutic activities;Neuromuscular re-education;Patient/family education;Manual techniques;Dry needling;Electrical Stimulation;Iontophoresis 4mg /ml Dexamethasone    PT Next Visit Plan scapular, rotator cuff strengthening. glenohumeral mechanics,  manual techniques, modalities PRN    PT Home Exercise Plan [employer] Access Code: [email or web address]    Consulted and Agree with Plan of Care Patient               Loralyn Freshwater PT, DPT  02/16/2023, 1:49 PM

## 2023-02-19 ENCOUNTER — Ambulatory Visit: Payer: Medicare Other

## 2023-02-19 DIAGNOSIS — G8929 Other chronic pain: Secondary | ICD-10-CM

## 2023-02-19 DIAGNOSIS — M25512 Pain in left shoulder: Secondary | ICD-10-CM | POA: Diagnosis not present

## 2023-02-19 DIAGNOSIS — M25612 Stiffness of left shoulder, not elsewhere classified: Secondary | ICD-10-CM

## 2023-02-19 NOTE — Therapy (Signed)
OUTPATIENT PHYSICAL THERAPY TREATMENT NOTE    Patient Name: Melissa Malone MRN: [id number] DOB:10/13/1955, 67 y.o., female Today's Date: 02/19/2023  PCP: Margarita Mail, DO  REFERRING PROVIDER: Margarita Mail, DO   END OF SESSION:  PT End of Session - 02/19/23 0950     Visit Number 13    Number of Visits 17    Date for PT Re-Evaluation 03/05/23    Authorization Type [employer] eprimary, [employer] secondary    Progress Note Due on Visit 20    PT Start Time 0950    PT Stop Time 1029    PT Time Calculation (min) 39 min    Activity Tolerance Patient tolerated treatment well    Behavior During Therapy WFL for tasks assessed/performed                      Past Medical History:  Diagnosis Date   Arthritis    Depression    Eczema    Environmental allergies    Glaucoma    H/O cold sores    Hyperlipidemia    IBS (irritable bowel syndrome)    Insomnia    Macular degeneration    Over weight    Past Surgical History:  Procedure Laterality Date   BREAST EXCISIONAL BIOPSY Right 1989   neg   COLONOSCOPY WITH PROPOFOL N/A 12/19/2021   Procedure: COLONOSCOPY WITH PROPOFOL;  Surgeon: Toney Reil, MD;  Location: [facility] ENDOSCOPY;  Service: Gastroenterology;  Laterality: N/A;   DILATION AND CURETTAGE OF UTERUS     INCISION AND DRAINAGE BREAST ABSCESS     SKIN BIOPSY     Patient Active Problem List   Diagnosis Date Noted   Family history of colon cancer    Colon cancer screening    Polyp of transverse colon    Hyperlipemia 11/04/2021   Macular degeneration 11/04/2021   Environmental allergies 11/04/2021   Arthritis 11/04/2021   Glaucoma 11/04/2021   MDD (major depressive disorder) 11/04/2021   Eczema 11/04/2021   H/O cold sores 11/04/2021   Insomnia 11/04/2021   Statin intolerance 11/04/2021    REFERRING DIAG: M25.512,G89.29 (ICD-10-CM) - Chronic left shoulder pain   THERAPY DIAG:  Chronic left shoulder pain  Stiffness of left shoulder, not  elsewhere classified  Rationale for Evaluation and Treatment Rehabilitation  PERTINENT HISTORY: Chronic L shoulder pain. Sudden onset. Pt was putting an empty box up to the attic, the box fell and pt over flexed (past end range flexion) her L arm and shoulder resulting in the injury December 2023. Pain has worsened since onset. Has not yet had PT for her L shoulder. Pt is R hand dominant.   PRECAUTIONS: Latex allergies   SUBJECTIVE:   SUBJECTIVE STATEMENT: L shoulder is good. No pain currently. Better able to don and doff her bra strap, reach up to a higher cupboard, better able to sleep on her L shoulder   PAIN:  Are you having pain? See subjective   TODAY'S TREATMENT:  DATE: 02/19/2023   Latex allergies Perfume No blood pressure problems per pt.  No osteoporosis     Decreased L shoulder pain with flexion with addition of L scapular retraction and posterior tipping.     Therapeutic exercise  Standing L shoulder flexion AROM   145 degrees at start of session.   Sitting with L arm propped into about 100 degrees flexion   Triceps extension resisting  Green band 10x2 with 5 second holds  Good posterior and inferior glide of humeral head palpated.    155 degrees L shoulder flexion AROM afterwards  Standing L shoulder extension green band attached to top of door 10x2  Standing PNF D2 flexion yellow band   L 5x3   Standing PNF D2 extension green band   L 10x2  With PT A to P pressure to L humeral head  L shoulder function IR 10x3  Standing L shoulder IR blue band 10x5 seconds for 3 sets   Improved comfort level with function IR afterwards.      Improved exercise technique, movement at target joints, use of target muscles after mod verbal, visual, tactile cues.     Response to treatment Improved L shoulder flexion and  functional IR AROM and comfort at end range after session.       Clinical impression  Improved L shoulder flexion AROM with exercises to promote posterior and inferior glide of humeral head. Improved functional L shoulder IR with exercises to improve IR strength, especially her subscapularis muscle.  Pt will benefit from continued skilled physical therapy services to decrease pain, improve strength, AROM, and function.       PATIENT EDUCATION: Education details: there-ex, HEP Person educated: Patient Education method: Explanation, Demonstration, Tactile cues, Verbal cues, and Handouts Education comprehension: verbalized understanding and returned demonstration  HOME EXERCISE PROGRAM: Access Code: [email or web address] URL: [email or web address] Date: 01/07/2023 Prepared by: Loralyn Freshwater   Exercises - Isometric Shoulder Internal Rotation  - 2 x daily - 7 x weekly - 3 sets - 10 reps - 5 seconds hold - Single Arm Shoulder Extension with Anchored Resistance  - 1 x daily - 7 x weekly - 3 sets - 10 reps - 5 seconds hold  Yellow band    - Seated Shoulder Inferior Glide  - 1 x daily - 7 x weekly - 3 sets - 10 reps - 5 seconds hold  - Scapular Retraction with Resistance  - 1 x daily - 7 x weekly - 3 sets - 10 reps - 5 seconds hold Red band  - Prone Scapular Protraction Retraction AROM on Forearms  - 1 x daily - 7 x weekly - 3 sets - 10 reps - 5 seconds hold  Standing L elbow extension isometrics resisting R hand  2x5 seconds. Reviewed and given as part of HEP 10x3 with 5 second holds per day.   - Standing Shoulder Internal Rotation AROM Behind Back  - 1 x daily - 7 x weekly - 3 sets - 10 reps - Standing Shoulder External Rotation with Resistance  - 1 x daily - 7 x weekly - 3 sets - 10 reps   Yellow latex free band  - Standing Shoulder Internal Rotation with Anchored Resistance  - 1 x daily - 7 x weekly - 3 sets - 10 reps - 5 seconds hold      PT Short Term Goals - 02/02/23  1035       PT SHORT TERM GOAL #1   Title Pt will be  independent with her initial HEP to decrease pain, improve strength, ability to reach and don and doff articles of clothing more comfortably.    Baseline Pt has started her initial HEP (01/07/2023); Doing her HEP, no questions (02/02/2023)    Time 3    Period Weeks    Status Achieved    Target Date 01/29/23              PT Long Term Goals - 02/08/23 1120       PT LONG TERM GOAL #1   Title Pt will have a decrease in L shoulder pain to 2/10 or less at worst to promote ability to reach as well as don and doff articles of clothing more comfortably.    Baseline 8/10 L shoulder pain at worst for the past 3 months (01/07/2023); 7/10 at worst for the past 7 days but only very briefly. The frequency of worst pain is less since starting PT, only about 2-3 times a weeks. Pain goes away within seconds. (02/02/2023); 5/10 at worst for the past 7 days (02/08/2023)    Time 8    Period Weeks    Status Partially Met    Target Date 03/05/23      PT LONG TERM GOAL #2   Title Pt will improve her L shoulder functional IR AROM to thumb to T7 spinous process to decrease difficulty donning and doffing her bra.    Baseline L shoulder functional IR AROM: Functional IR: L thumb to L 1 spinous process. (01/07/2023); L thumb to T11 spinous process (02/02/2023); L thumb to T11 spinous process (02/08/2023)    Time 8    Period Weeks    Status Partially Met    Target Date 03/05/23      PT LONG TERM GOAL #3   Title Pt will improve her L shoulder flexion AROM to 155 degrees or more to promote ability to reach more comfortably.    Baseline L shoulder flexion AROM 140 degrees AROM (01/07/2023); 138 degrees, 151 degrees after ER muscle strengthening (02/02/2023); 151 degrees at start of session (02/08/2023)    Time 8    Period Weeks    Status Partially Met    Target Date 03/05/23      PT LONG TERM GOAL #4   Title Pt will improve her L shoulder FOTO score by at least 10  points as a demonstration of improved function.    Baseline L shoulder FOTO 63 (01/07/2023); 65 (02/02/2023)    Time 8    Period Weeks    Status On-going    Target Date 03/05/23              Plan - 02/19/23 0948     Clinical Impression Statement Improved L shoulder flexion AROM with exercises to promote posterior and inferior glide of humeral head. Improved functional L shoulder IR with exercises to improve IR strength, especially her subscapularis muscle.  Pt will benefit from continued skilled physical therapy services to decrease pain, improve strength, AROM, and function.    Personal Factors and Comorbidities Comorbidity 3+;Age;Time since onset of injury/illness/exacerbation;Fitness    Comorbidities Arthritis, depression, insomnia    Examination-Activity Limitations Reach Overhead;Dressing;Hygiene/Grooming;Lift;Sleep    Stability/Clinical Decision Making Stable/Uncomplicated    Rehab Potential Fair    PT Frequency 2x / week    PT Duration 8 weeks    PT Treatment/Interventions Therapeutic exercise;Therapeutic activities;Neuromuscular re-education;Patient/family education;Manual techniques;Dry needling;Electrical Stimulation;Iontophoresis 4mg /ml Dexamethasone    PT Next Visit Plan scapular, rotator cuff strengthening. glenohumeral  mechanics, manual techniques, modalities PRN    PT Home Exercise Plan Medbridge Access Code: [email or web address]    Consulted and Agree with Plan of Care Patient                Loralyn Freshwater PT, DPT  02/19/2023, 10:32 AM

## 2023-02-24 ENCOUNTER — Other Ambulatory Visit: Payer: Self-pay | Admitting: Internal Medicine

## 2023-02-24 ENCOUNTER — Ambulatory Visit: Payer: Medicare Other | Attending: Internal Medicine

## 2023-02-24 DIAGNOSIS — Z8619 Personal history of other infectious and parasitic diseases: Secondary | ICD-10-CM

## 2023-02-24 DIAGNOSIS — M25612 Stiffness of left shoulder, not elsewhere classified: Secondary | ICD-10-CM | POA: Insufficient documentation

## 2023-02-24 DIAGNOSIS — G8929 Other chronic pain: Secondary | ICD-10-CM | POA: Insufficient documentation

## 2023-02-24 DIAGNOSIS — M25512 Pain in left shoulder: Secondary | ICD-10-CM | POA: Diagnosis present

## 2023-02-24 NOTE — Therapy (Signed)
OUTPATIENT PHYSICAL THERAPY TREATMENT NOTE And Discharge Summary    Patient Name: Melissa Malone MRN: [id number] DOB:Aug 08, 1956, 67 y.o., female Today's Date: 02/24/2023  PCP: Margarita Mail, DO  REFERRING PROVIDER: Margarita Mail, DO   END OF SESSION:  PT End of Session - 02/24/23 1118     Visit Number 14    Number of Visits 17    Date for PT Re-Evaluation 03/05/23    Authorization Type [employer] eprimary, AARP secondary    Progress Note Due on Visit 20    PT Start Time 1118    PT Stop Time 1201    PT Time Calculation (min) 43 min    Activity Tolerance Patient tolerated treatment well    Behavior During Therapy WFL for tasks assessed/performed                       Past Medical History:  Diagnosis Date   Arthritis    Depression    Eczema    Environmental allergies    Glaucoma    H/O cold sores    Hyperlipidemia    IBS (irritable bowel syndrome)    Insomnia    Macular degeneration    Over weight    Past Surgical History:  Procedure Laterality Date   BREAST EXCISIONAL BIOPSY Right 1989   neg   COLONOSCOPY WITH PROPOFOL N/A 12/19/2021   Procedure: COLONOSCOPY WITH PROPOFOL;  Surgeon: Toney Reil, MD;  Location: [facility];  Service: Gastroenterology;  Laterality: N/A;   DILATION AND CURETTAGE OF UTERUS     INCISION AND DRAINAGE BREAST ABSCESS     SKIN BIOPSY     Patient Active Problem List   Diagnosis Date Noted   Family history of colon cancer    Colon cancer screening    Polyp of transverse colon    Hyperlipemia 11/04/2021   Macular degeneration 11/04/2021   Environmental allergies 11/04/2021   Arthritis 11/04/2021   Glaucoma 11/04/2021   MDD (major depressive disorder) 11/04/2021   Eczema 11/04/2021   H/O cold sores 11/04/2021   Insomnia 11/04/2021   Statin intolerance 11/04/2021    REFERRING DIAG: M25.512,G89.29 (ICD-10-CM) - Chronic left shoulder pain   THERAPY DIAG:  Chronic left shoulder pain  Stiffness of  left shoulder, not elsewhere classified  Rationale for Evaluation and Treatment Rehabilitation  PERTINENT HISTORY: Chronic L shoulder pain. Sudden onset. Pt was putting an empty box up to the attic, the box fell and pt over flexed (past end range flexion) her L arm and shoulder resulting in the injury December 2023. Pain has worsened since onset. Has not yet had PT for her L shoulder. Pt is R hand dominant.   PRECAUTIONS: Latex allergies   SUBJECTIVE:   SUBJECTIVE STATEMENT: L shoulder is doing fine. Has a little soreness when doing some of the exercises but goes away in a few minutes. No pain currently at rest. 1/10 at end range flexion. 4/10 (only a matter of seconds) L shoulder pain at worst for the past 7 days (morning). The HEP helps. Feels like she can graduate PT today and continue her progress with her HEP.    PAIN:  Are you having pain? See subjective   TODAY'S TREATMENT:  DATE: 02/24/2023   Latex allergies Perfume No blood pressure problems per pt.  No osteoporosis     Decreased L shoulder pain with flexion with addition of L scapular retraction and posterior tipping.     Therapeutic exercise  Cues for L scapular retraction with shoulder flexion. Decreased pain.   Standing L shoulder IR blue band 10x5 seconds for 3 sets   Improved comfort level with function IR afterwards.   Reviewed progress with PT towards goals  Reviewed POC: graduate PT and continue progress with HEP.  Time taken for FOTO  Standing L shoulder extension green band attached to top of door 10x3 with 5 second holds  Standing PNF D2 extension green band   L 5x with 5 second holds, then 10x     Improved exercise technique, movement at target joints, use of target muscles after mod verbal, visual, tactile cues.     Response to treatment Improved L shoulder  flexion and functional IR AROM and comfort at end range after session.       Clinical impression Pt demonstrates significant decrease in L shoulder pain level and duration, improved L shoulder flexion and functional IR AROM, and improved function since initial evaluation. Pt has made good progress with PT towards goals. Skilled PT services discharged with pt continuing her progress with her exercises at home.           PATIENT EDUCATION: Education details: there-ex, HEP Person educated: Patient Education method: Explanation, Demonstration, Tactile cues, Verbal cues, and Handouts Education comprehension: verbalized understanding and returned demonstration  HOME EXERCISE PROGRAM: Access Code: [email or web address] URL: [email or web address] Date: 01/07/2023 Prepared by: Loralyn Freshwater   Exercises - Isometric Shoulder Internal Rotation  - 2 x daily - 7 x weekly - 3 sets - 10 reps - 5 seconds hold - Single Arm Shoulder Extension with Anchored Resistance  - 1 x daily - 7 x weekly - 3 sets - 10 reps - 5 seconds hold  Yellow band    - Seated Shoulder Inferior Glide  - 1 x daily - 7 x weekly - 3 sets - 10 reps - 5 seconds hold  - Scapular Retraction with Resistance  - 1 x daily - 7 x weekly - 3 sets - 10 reps - 5 seconds hold Red band  - Prone Scapular Protraction Retraction AROM on Forearms  - 1 x daily - 7 x weekly - 3 sets - 10 reps - 5 seconds hold  Standing L elbow extension isometrics resisting R hand  2x5 seconds. Reviewed and given as part of HEP 10x3 with 5 second holds per day.   Increased to Green band (02/24/2023)   - Standing Shoulder Internal Rotation AROM Behind Back  - 1 x daily - 7 x weekly - 3 sets - 10 reps - Standing Shoulder External Rotation with Resistance  - 1 x daily - 7 x weekly - 3 sets - 10 reps   Yellow latex free band  - Standing Shoulder Internal Rotation with Anchored Resistance  - 1 x daily - 7 x weekly - 3 sets - 10 reps - 5 seconds  hold      PT Short Term Goals - 02/02/23 1035       PT SHORT TERM GOAL #1   Title Pt will be independent with her initial HEP to decrease pain, improve strength, ability to reach and don and doff articles of clothing more comfortably.    Baseline Pt has started her initial HEP (01/07/2023); Doing her  HEP, no questions (02/02/2023)    Time 3    Period Weeks    Status Achieved    Target Date 01/29/23              PT Long Term Goals - 02/24/23 1133       PT LONG TERM GOAL #1   Title Pt will have a decrease in L shoulder pain to 2/10 or less at worst to promote ability to reach as well as don and doff articles of clothing more comfortably.    Baseline 8/10 L shoulder pain at worst for the past 3 months (01/07/2023); 7/10 at worst for the past 7 days but only very briefly. The frequency of worst pain is less since starting PT, only about 2-3 times a weeks. Pain goes away within seconds. (02/02/2023); 5/10 at worst for the past 7 days (02/08/2023); 4/10  (02/24/2023)    Time 8    Period Weeks    Status Partially Met    Target Date 03/05/23      PT LONG TERM GOAL #2   Title Pt will improve her L shoulder functional IR AROM to thumb to T7 spinous process to decrease difficulty donning and doffing her bra.    Baseline L shoulder functional IR AROM: Functional IR: L thumb to L 1 spinous process. (01/07/2023); L thumb to T11 spinous process (02/02/2023); L thumb to T11 spinous process (02/08/2023); L thumb to T9 spinous process (02/24/2023)    Time 8    Period Weeks    Status Partially Met    Target Date 03/05/23      PT LONG TERM GOAL #3   Title Pt will improve her L shoulder flexion AROM to 155 degrees or more to promote ability to reach more comfortably.    Baseline L shoulder flexion AROM 140 degrees AROM (01/07/2023); 138 degrees, 151 degrees after ER muscle strengthening (02/02/2023); 151 degrees at start of session (02/08/2023); 157 degrees L shoulder flexion (02/24/2023)    Time 8    Period  Weeks    Status Achieved    Target Date 03/05/23      PT LONG TERM GOAL #4   Title Pt will improve her L shoulder FOTO score by at least 10 points as a demonstration of improved function.    Baseline L shoulder FOTO 63 (01/07/2023); 65 (02/02/2023); 67 (02/24/2023)    Time 8    Period Weeks    Status Partially Met    Target Date 03/05/23              Plan - 02/24/23 1117     Clinical Impression Statement Pt demonstrates significant decrease in L shoulder pain level and duration, improved L shoulder flexion and functional IR AROM, and improved function since initial evaluation. Pt has made good progress with PT towards goals. Skilled PT services discharged with pt continuing her progress with her exercises at home.    Personal Factors and Comorbidities Comorbidity 3+;Age;Time since onset of injury/illness/exacerbation;Fitness    Comorbidities Arthritis, depression, insomnia    Examination-Activity Limitations Reach Overhead;Dressing;Hygiene/Grooming;Lift;Sleep    Stability/Clinical Decision Making Stable/Uncomplicated    Rehab Potential --    PT Frequency --    PT Duration --    PT Treatment/Interventions Therapeutic exercise;Therapeutic activities;Neuromuscular re-education;Patient/family education;Manual techniques    PT Next Visit Plan Continue progress with her exercises at home.    PT Home Exercise Plan Medbridge Access Code: [email or web address]    Consulted and Agree with Plan of Care Patient  Thank you for your referral.   Loralyn Freshwater PT, DPT  02/24/2023, 12:16 PM

## 2023-02-24 NOTE — Telephone Encounter (Signed)
Request for 90 day supply Requested Prescriptions  Pending Prescriptions Disp Refills   acyclovir (ZOVIRAX) 400 MG tablet [Pharmacy Med Name: ACYCLOVIR 400 MG TABLET] 270 tablet 0    Sig: TAKE 1 TABLET BY MOUTH 3 TIMES DAILY FOR 7-10 DAYS AT THE ONSET OF SYMPTOMS.     Antimicrobials:  Antiviral Agents - Anti-Herpetic Passed - 02/24/2023 12:17 PM      Passed - Valid encounter within last 12 months    Recent Outpatient Visits           1 month ago Mixed hyperlipidemia   [facility] Margarita Mail, DO   3 months ago Chronic left shoulder pain   [facility] Margarita Mail, DO   1 year ago Routine general medical examination at a health care facility   [facility] Margarita Mail, DO   1 year ago Left foot pain   [facility] Margarita Mail, [place]

## 2023-03-17 ENCOUNTER — Ambulatory Visit
Admission: RE | Admit: 2023-03-17 | Discharge: 2023-03-17 | Disposition: A | Discharge status: Home / Self Care | Payer: Medicare Other | Source: Ambulatory Visit | Attending: Internal Medicine | Admitting: Internal Medicine

## 2023-03-17 DIAGNOSIS — Z1231 Encounter for screening mammogram for malignant neoplasm of breast: Secondary | ICD-10-CM | POA: Insufficient documentation

## 2023-05-30 ENCOUNTER — Other Ambulatory Visit: Payer: Self-pay | Admitting: Internal Medicine

## 2023-05-30 DIAGNOSIS — Z8619 Personal history of other infectious and parasitic diseases: Secondary | ICD-10-CM

## 2023-06-01 NOTE — Telephone Encounter (Signed)
Requested Prescriptions  Pending Prescriptions Disp Refills   acyclovir (ZOVIRAX) 400 MG tablet [Pharmacy Med Name: ACYCLOVIR 400 MG TABLET] 270 tablet 0    Sig: TAKE 1 TABLET BY MOUTH 3 TIMES DAILY FOR 7-10 DAYS AT THE ONSET OF SYMPTOMS.     Antimicrobials:  Antiviral Agents - Anti-Herpetic Passed - 05/30/2023  2:31 PM      Passed - Valid encounter within last 12 months    Recent Outpatient Visits           4 months ago Mixed hyperlipidemia   [facility] Margarita Mail, DO   6 months ago Chronic left shoulder pain   [facility] Margarita Mail, DO   1 year ago Routine general medical examination at a health care facility   [facility] Margarita Mail, DO   1 year ago Left foot pain   [facility] Margarita Mail, [place]

## 2024-01-24 ENCOUNTER — Ambulatory Visit (INDEPENDENT_AMBULATORY_CARE_PROVIDER_SITE_OTHER): Admitting: Internal Medicine

## 2024-01-24 ENCOUNTER — Encounter: Payer: Self-pay | Admitting: Internal Medicine

## 2024-01-24 ENCOUNTER — Other Ambulatory Visit: Payer: Self-pay

## 2024-01-24 VITALS — BP 110/72 | HR 89 | Temp 98.0°F | Resp 16 | Ht 60.0 in | Wt 149.0 lb

## 2024-01-24 DIAGNOSIS — N9089 Other specified noninflammatory disorders of vulva and perineum: Secondary | ICD-10-CM

## 2024-01-24 NOTE — Progress Notes (Signed)
   Acute Office Visit  Subjective:     Patient ID: Melissa Malone, female    DOB: Jul 24, 1956, 68 y.o.   MRN: [id number]  Chief Complaint  Patient presents with   lesion    In pubic area hair red and painful at times    HPI Patient is in today for lesion in the pubic area.   Discussed the use of AI scribe software for clinical note transcription with the patient, who gave verbal consent to proceed.  History of Present Illness Melissa Malone is a 68 year old female who presents with a lesion at the bottom of the pubic hair.  The lesion is centrally located at the bottom of the pubic hair and was recently discovered. She has experienced similar lesions in the past, which were smaller and resolved within a month. Previously, infected hairs required removal for healing. She recently completed a ten-day course of amoxicillin for a tooth infection, but the lesion did not improve.  She has irritable bowel syndrome, leading to occasional bowel leakage and the use of pull-up garments, which may cause friction. A tick was recently found on her dog, but there is no evidence of a bug bite. There has been no recent sexual activity and no other significant health changes.    Review of Systems  Constitutional:  Negative for chills and fever.        Objective:    BP 110/72 (Cuff Size: Normal)   Pulse 89   Temp 98 F (36.7 C) (Oral)   Resp 16   Ht 5' (1.524 m)   Wt 149 lb (67.6 kg)   SpO2 95%   BMI 29.10 kg/m    Physical Exam Exam conducted with a chaperone present.  Constitutional:      Appearance: Normal appearance.  HENT:     Head: Normocephalic and atraumatic.  Eyes:     Conjunctiva/sclera: Conjunctivae normal.  Cardiovascular:     Rate and Rhythm: Normal rate and regular rhythm.  Pulmonary:     Effort: Pulmonary effort is normal.     Breath sounds: Normal breath sounds.  Genitourinary:    Labia:        Right: Lesion present.      Comments: Small raised bump on the  right central vulva but does not appear to be infected Skin:    General: Skin is warm and dry.  Neurological:     General: No focal deficit present.     Mental Status: She is alert. Mental status is at baseline.  Psychiatric:        Mood and Affect: Mood normal.        Behavior: Behavior normal.     No results found for any visits on 01/24/24.      Assessment & Plan:   Assessment and Plan Assessment & Plan Skin irritation in pubic area Likely friction irritation from walking or garments. No infection. Antibiotics for dental infection had no effect, indicating non-infectious cause. - Apply Vaseline to affected area. - Monitor for increased tenderness or drainage. - Re-examine or refer to gynecology if symptoms worsen.    Return in about 5 months (around 06/25/2024) for physical.  Rockney Cid, DO

## 2024-04-10 IMAGING — MG MM DIGITAL SCREENING BILAT W/ TOMO AND CAD
8 series · 9 of 24 positions shown · non-contrast
Comparison: Previous exam(s).

CLINICAL DATA: Screening.

EXAM:
DIGITAL SCREENING BILATERAL MAMMOGRAM WITH TOMOSYNTHESIS AND CAD
TECHNIQUE: Bilateral screening digital craniocaudal and mediolateral oblique
mammograms were obtained. Bilateral screening digital breast
tomosynthesis was performed. The images were evaluated with
computer-aided detection.

[L CC synth-2D]
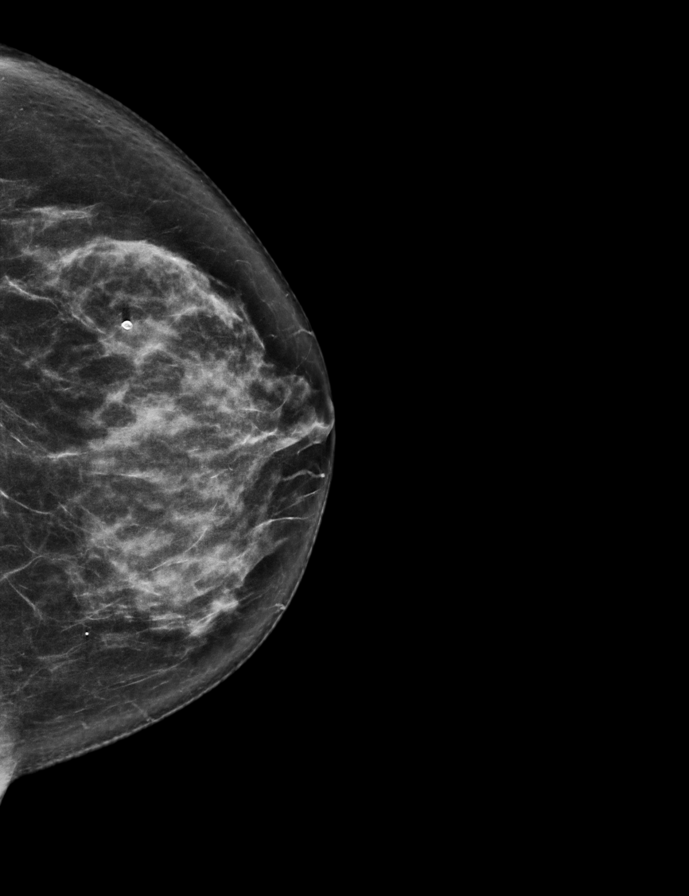

[L MLO synth-2D]
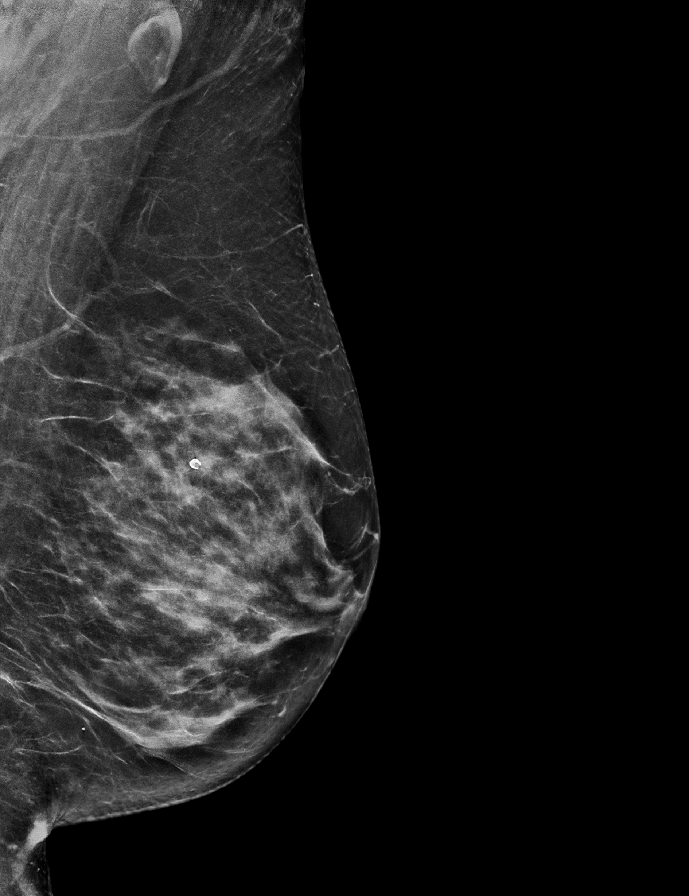

[R CC synth-2D]
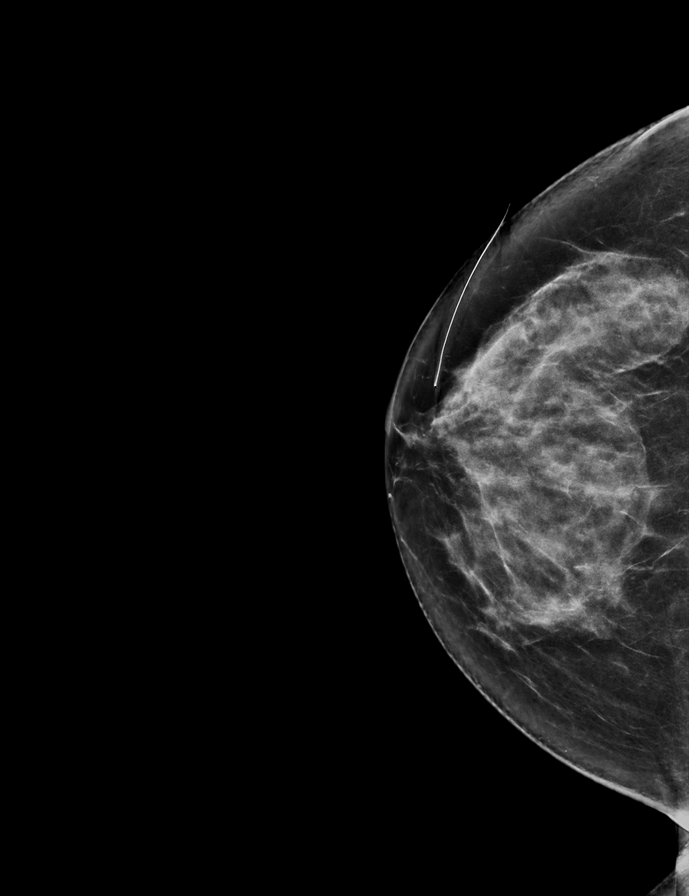

[R MLO synth-2D]
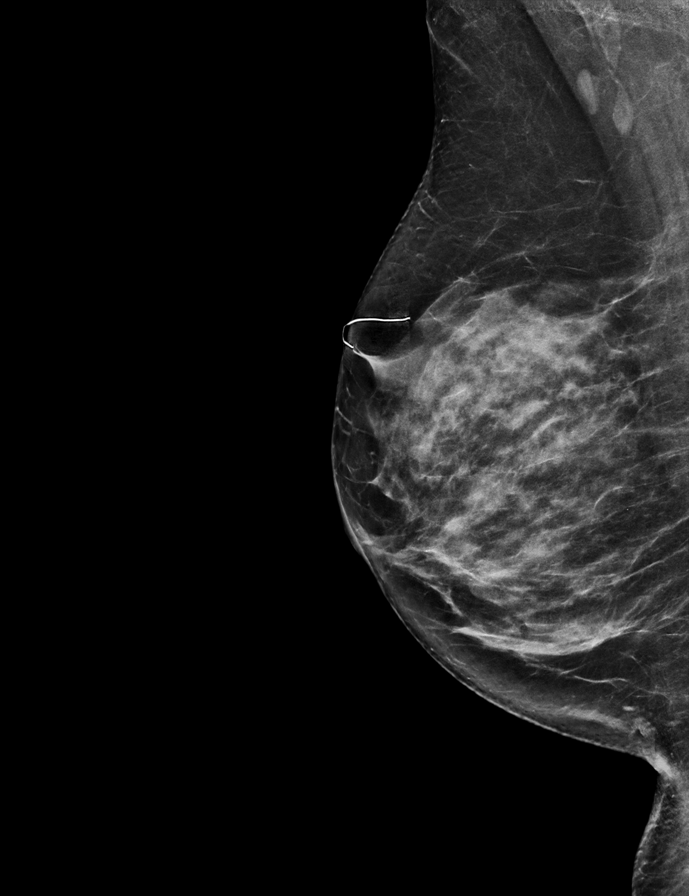

[L MLO tomo · 2 of 68 frames shown]
[frame 22/68]
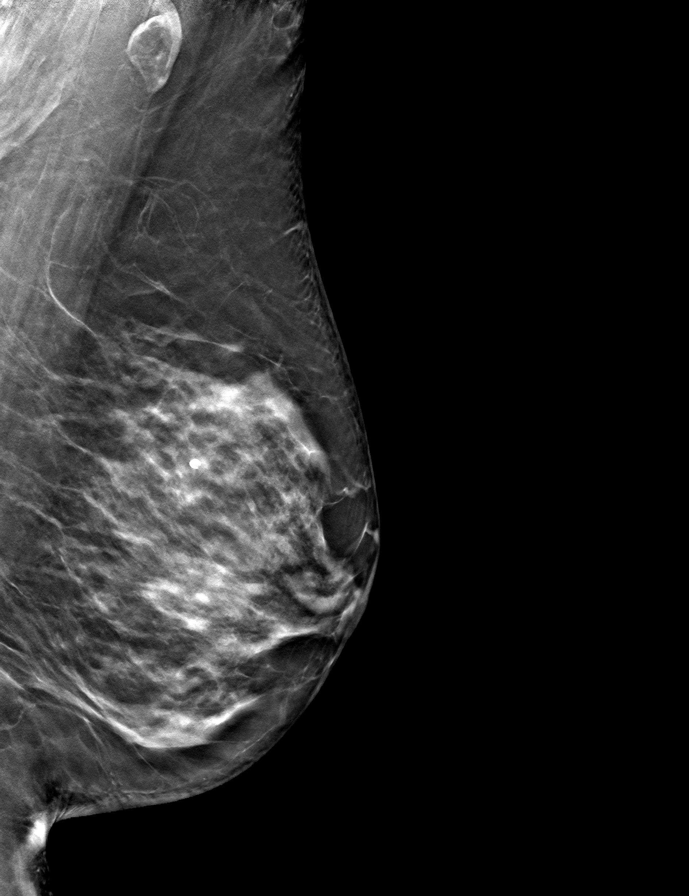
[frame 35/68]
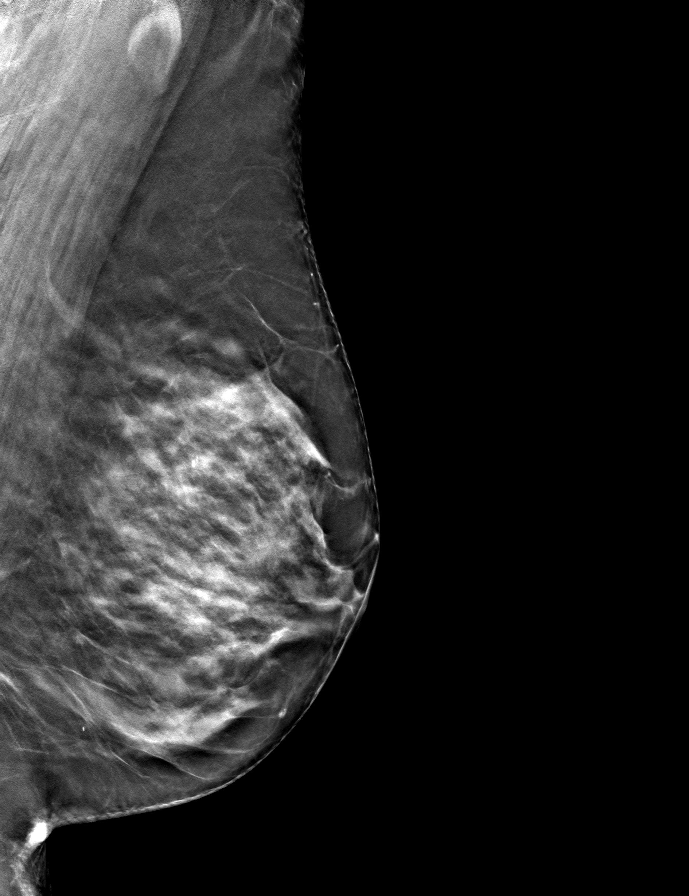

[R CC tomo · tomo slice 36/71.0]
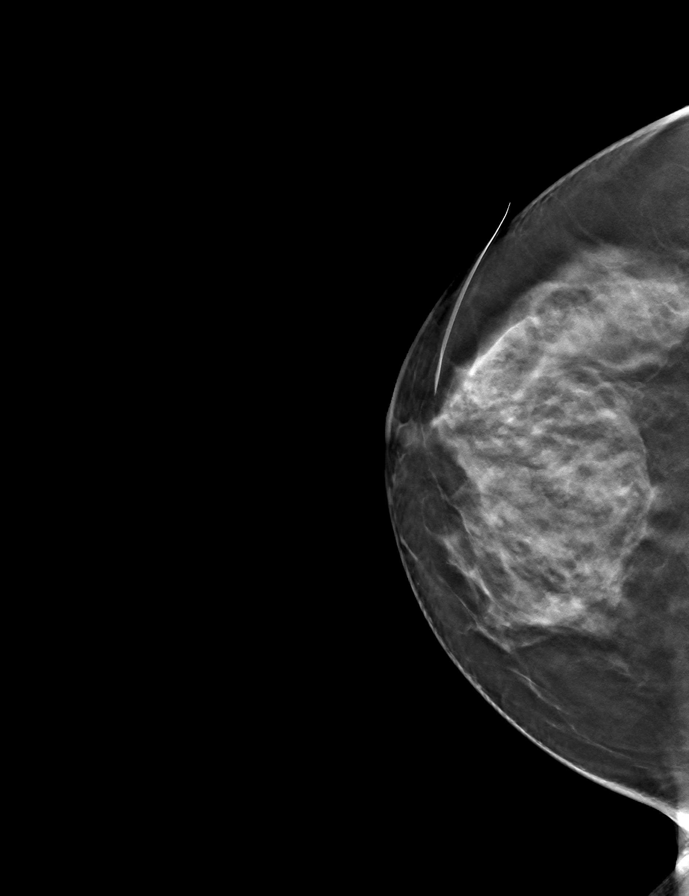

[L CC tomo · tomo slice 35/68.0]
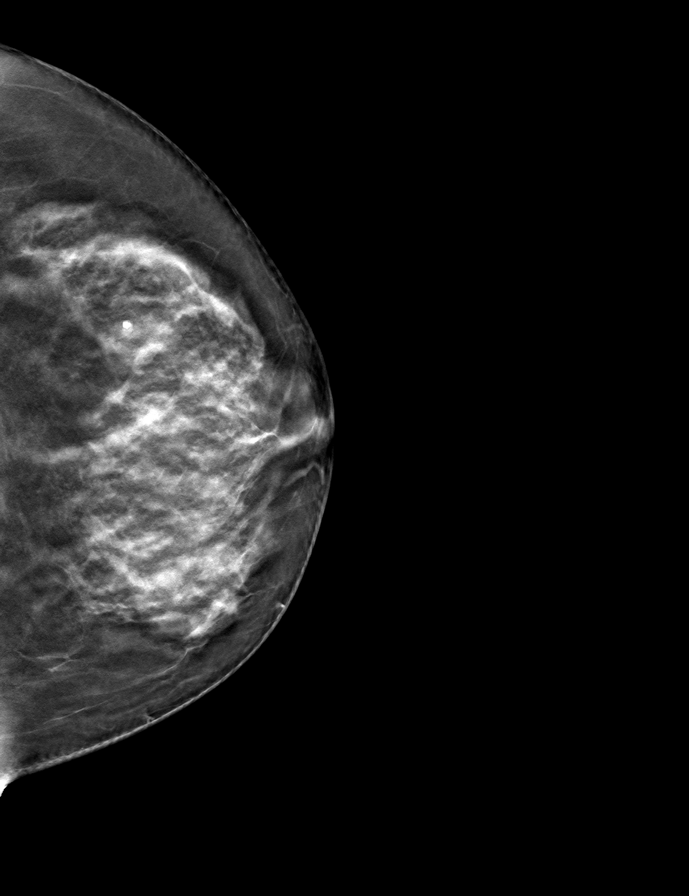

[R MLO tomo · tomo slice 33/66.0]
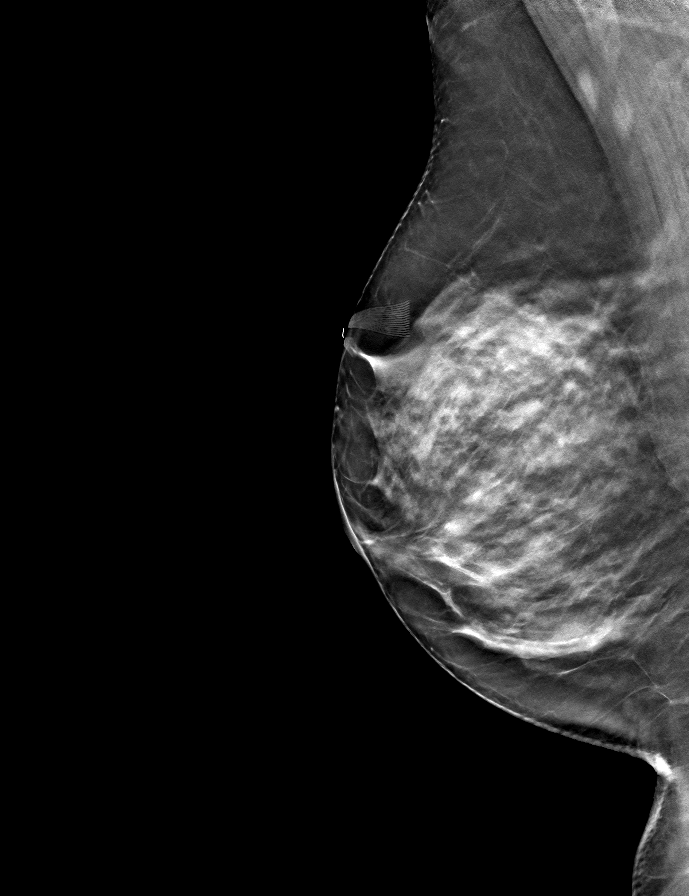

[9 of 24 positions shown; findings below may reference images not displayed]

ACR Breast Density Category c: The breast tissue is heterogeneously
dense, which may obscure small masses.
FINDINGS: There are no findings suspicious for malignancy.
IMPRESSION: No mammographic evidence of malignancy. A result letter of this
screening mammogram will be mailed directly to the patient.

RECOMMENDATION:
Screening mammogram in one year. (Code:Q3-W-BC3)

BI-RADS CATEGORY  1: Negative.

## 2024-07-07 ENCOUNTER — Encounter

## 2024-07-10 ENCOUNTER — Ambulatory Visit: Admitting: Internal Medicine

## 2024-09-29 ENCOUNTER — Ambulatory Visit

## 2024-09-29 DIAGNOSIS — Z1211 Encounter for screening for malignant neoplasm of colon: Secondary | ICD-10-CM

## 2024-09-29 DIAGNOSIS — Z0000 Encounter for general adult medical examination without abnormal findings: Secondary | ICD-10-CM

## 2024-09-29 DIAGNOSIS — Z1231 Encounter for screening mammogram for malignant neoplasm of breast: Secondary | ICD-10-CM

## 2024-09-29 NOTE — Progress Notes (Signed)
 "  Chief Complaint  Patient presents with   Medicare Wellness     Subjective:   Melissa Malone is a 69 y.o. female who presents for a Medicare Annual Wellness Visit.  Visit info / Clinical Intake: Medicare Wellness Visit Type:: Subsequent Annual Wellness Visit Persons participating in visit and providing information:: patient Medicare Wellness Visit Mode:: Telephone If telephone:: video declined Since this visit was completed virtually, some vitals may be partially provided or unavailable. Missing vitals are due to the limitations of the virtual format.: Unable to obtain vitals - no equipment If Telephone or Video please confirm:: I connected with patient using audio/video enable telemedicine. I verified patient identity with two identifiers, discussed telehealth limitations, and patient agreed to proceed. Patient Location:: HOME Provider Location:: OFFICE Interpreter Needed?: No Pre-visit prep was completed: yes AWV questionnaire completed by patient prior to visit?: no Living arrangements:: (!) lives alone Patient's Overall Health Status Rating: good Typical amount of pain: none Does pain affect daily life?: no Are you currently prescribed opioids?: no  Dietary Habits and Nutritional Risks How many meals a day?: 3 Eats fruit and vegetables daily?: yes Most meals are obtained by: preparing own meals In the last 2 weeks, have you had any of the following?: none Diabetic:: no  Functional Status Activities of Daily Living (to include ambulation/medication): Independent Ambulation: Independent Medication Administration: Independent Home Management (perform basic housework or laundry): Independent Manage your own finances?: yes Primary transportation is: driving Concerns about vision?: no *vision screening is required for WT[facility]* (WEARS GLASSES- DR.BRASINGTON) Concerns about hearing?: no  Fall Screening Falls in the past year?: 1 Number of falls in past year: 0 Was there an  injury with Fall?: 0 Fall Risk Category Calculator: 1 Patient Fall Risk Level: Low Fall Risk  Fall Risk Patient at Risk for Falls Due to: History of fall(s) Fall risk Follow up: Falls evaluation completed; Falls prevention discussed  Home and Transportation Safety: All rugs have non-skid backing?: yes All stairs or steps have railings?: N/A, no stairs Grab bars in the bathtub or shower?: yes Have non-skid surface in bathtub or shower?: yes Good home lighting?: yes Regular seat belt use?: yes Hospital stays in the last year:: no  Cognitive Assessment Difficulty concentrating, remembering, or making decisions? : yes (MEMORY) Will 6CIT or Mini Cog be Completed: yes What year is it?: 0 points What month is it?: 0 points Give patient an address phrase to remember (5 components): [address] [place], [place] About what time is it?: 0 points Count backwards from 20 to 1: 0 points Say the months of the year in reverse: 0 points Repeat the address phrase from earlier: 0 points 6 CIT Score: 0 points  Advance Directives (For Healthcare) Does Patient Have a Medical Advance Directive?: No Would patient like information on creating a medical advance directive?: No - Patient declined  Reviewed/Updated  Reviewed/Updated: Reviewed All (Medical, Surgical, Family, Medications, Allergies, Care Teams, Patient Goals)    Allergies (verified) Bee pollen, Codeine, Grass pollen(k-o-r-t-swt vern), Latex, Molds & smuts, Nickel, Proparacaine, Statins, and Sulfa antibiotics   Current Medications (verified) Outpatient Encounter Medications as of 09/29/2024  Medication Sig   acetaminophen (TYLENOL) 500 MG tablet Take 500 mg by mouth every 6 (six) hours as needed.   acyclovir  (ZOVIRAX ) 400 MG tablet TAKE 1 TABLET BY MOUTH 3 TIMES DAILY FOR 7-10 DAYS AT THE ONSET OF SYMPTOMS.   BRIMONIDINE TARTRATE-TIMOLOL OP Apply to eye.   CALCIUM 600 1500 (600 Ca) MG TABS tablet  carboxymethylcellulose (REFRESH  PLUS) 0.5 % SOLN 1 drop 3 (three) times daily as needed.   cetirizine (ZYRTEC) 10 MG tablet    Cinnamon 500 MG capsule Take 500 mg by mouth daily.   diphenhydrAMINE (BENADRYL) 50 MG capsule Take 50 mg by mouth every 6 (six) hours as needed.   erythromycin ophthalmic ointment 1 Application at bedtime. (Patient taking differently: 1 Application at bedtime. TAKES PRN)   latanoprost (XALATAN) 0.005 % ophthalmic solution    Melatonin 3 MG CAPS as needed.   Multiple Vitamin (MULTIVITAMIN) tablet Take 1 tablet by mouth daily.   NAPROXEN SODIUM PO Take by mouth.   Omega-3 Fatty Acids (FISH OIL) 1200 MG CAPS Take by mouth.   pimecrolimus  (ELIDEL ) 1 % cream Apply topically as needed.   Probiotic Product (PROBIOTIC-10 PO) Take by mouth every other day. (Patient taking differently: Take by mouth every other day. TAKES PRN)   vitamin D3 (CHOLECALCIFEROL) 25 MCG tablet Take 1,000 Units by mouth daily.   acyclovir  ointment (ZOVIRAX ) 5 % Apply 1 Application topically as needed. (Patient not taking: Reported on 09/29/2024)   Ashwagandha 500 MG CAPS Take by mouth. (Patient not taking: Reported on 09/29/2024)   ibuprofen (ADVIL) 600 MG tablet Take 600 mg by mouth every 6 (six) hours as needed. (Patient not taking: Reported on 09/29/2024)   magnesium 30 MG tablet Take 30 mg by mouth 2 (two) times daily. (Patient not taking: Reported on 09/29/2024)   No facility-administered encounter medications on file as of 09/29/2024.    History: Past Medical History:  Diagnosis Date   Arthritis    Depression    Eczema    Environmental allergies    Glaucoma    H/O cold sores    Hyperlipidemia    IBS (irritable bowel syndrome)    Insomnia    Macular degeneration    Over weight    Past Surgical History:  Procedure Laterality Date   BREAST EXCISIONAL BIOPSY Right 1989   neg   COLONOSCOPY WITH PROPOFOL  N/A 12/19/2021   Procedure: COLONOSCOPY WITH PROPOFOL ;  Surgeon: Unk Corinn Skiff, MD;  Location: [facility] ENDOSCOPY;   Service: Gastroenterology;  Laterality: N/A;   DILATION AND CURETTAGE OF UTERUS     INCISION AND DRAINAGE BREAST ABSCESS     SKIN BIOPSY     Family History  Problem Relation Age of Onset   Arthritis Mother    Diabetes Mother    Heart disease Mother    COPD Mother    Dementia Mother    Macular degeneration Mother    Macular degeneration Father    Arthritis Father    Heart disease Father    Cancer Father        lung   COPD Father    Macular degeneration Sister    Diabetes Sister    Obesity Sister    Irregular heart beat Sister    Depression Daughter    Obesity Daughter    Breast cancer Maternal Aunt    Breast cancer Maternal Aunt    Breast cancer Paternal Aunt    Breast cancer Paternal Aunt    Irregular heart beat Brother    Cancer Brother        COLON   Obesity Brother    Social History   Occupational History   Not on file  Tobacco Use   Smoking status: Never   Smokeless tobacco: Never  Vaping Use   Vaping status: Never Used  Substance and Sexual Activity   Alcohol use: Yes  Comment: 1-2 PER MONTH   Drug use: Never   Sexual activity: Not Currently   Tobacco Counseling Counseling given: Not Answered  SDOH Screenings   Food Insecurity: No Food Insecurity (01/08/2023)  Housing: Low Risk (01/08/2023)  Transportation Needs: No Transportation Needs (01/08/2023)  Utilities: Not At Risk (01/08/2023)  Alcohol Screen: Low Risk (01/08/2023)  Depression (PHQ2-9): Low Risk (09/29/2024)  Financial Resource Strain: Low Risk (01/08/2023)  Physical Activity: Insufficiently Active (01/08/2023)  Social Connections: Moderately Isolated (01/08/2023)  Stress: No Stress Concern Present (09/29/2024)  Tobacco Use: Low Risk (09/29/2024)   See flowsheets for full screening details  Depression Screen PHQ 2 & 9 Depression Scale- Over the past 2 weeks, how often have you been bothered by any of the following problems? Little interest or pleasure in doing things: 0 Feeling down, depressed,  or hopeless (PHQ Adolescent also includes...irritable): 0 PHQ-2 Total Score: 0 Trouble falling or staying asleep, or sleeping too much: 0 Feeling tired or having little energy: 0 Poor appetite or overeating (PHQ Adolescent also includes...weight loss): 0 Feeling bad about yourself - or that you are a failure or have let yourself or your family down: 0 Trouble concentrating on things, such as reading the newspaper or watching television (PHQ Adolescent also includes...like school work): 0 Moving or speaking so slowly that other people could have noticed. Or the opposite - being so fidgety or restless that you have been moving around a lot more than usual: 0 Thoughts that you would be better off dead, or of hurting yourself in some way: 0 PHQ-9 Total Score: 0 If you checked off any problems, how difficult have these problems made it for you to do your work, take care of things at home, or get along with other people?: Not difficult at all  Depression Treatment Depression Interventions/Treatment : EYV7-0 Score <4 Follow-up Not Indicated     Goals Addressed             This Visit's Progress    DIET - EAT MORE FRUITS AND VEGETABLES               Objective:    There were no vitals filed for this visit. There is no height or weight on file to calculate BMI.  Hearing/Vision screen Hearing Screening - Comments:: NO AIDS Vision Screening - Comments:: WEARS GLASSES- DR.BRASINGTON- HAS GLAUCOMA, MAC DEG Immunizations and Health Maintenance Health Maintenance  Topic Date Due   Zoster Vaccines- Shingrix (1 of 2) Never done   DTaP/Tdap/Td (2 - Td or Tdap) 09/22/2023   Influenza Vaccine  04/21/2024   COVID-19 Vaccine (7 - 2025-26 season) 05/22/2024   Colonoscopy  12/19/2024   Mammogram  03/16/2025   Medicare Annual Wellness (AWV)  09/29/2025   Pneumococcal Vaccine: 50+ Years  Completed   Bone Density Scan  Completed   Hepatitis C Screening  Completed   Meningococcal B Vaccine  Aged  Out        Assessment/Plan:  This is a routine wellness examination for Melissa Malone.  Patient Care Team: Bernardo Fend, DO as PCP - General (Internal Medicine) Mittie Gaskin, MD as Referring Physician (Ophthalmology)  I have personally reviewed and noted the following in the patients chart:   Medical and social history Use of alcohol, tobacco or illicit drugs  Current medications and supplements including opioid prescriptions. Functional ability and status Nutritional status Physical activity Advanced directives List of other physicians Hospitalizations, surgeries, and ER visits in previous 12 months Vitals Screenings to include cognitive, depression, and  falls Referrals and appointments  No orders of the defined types were placed in this encounter.  In addition, I have reviewed and discussed with patient certain preventive protocols, quality metrics, and best practice recommendations. A written personalized care plan for preventive services as well as general preventive health recommendations were provided to patient.   Melissa Malone Das, LPN   8/0/7973   Return in 1 year (on 09/29/2025).  After Visit Summary: (MyChart) Due to this being a telephonic visit, the after visit summary with patients personalized plan was offered to patient via MyChart   Nurse Notes: NEEDS TDAP, SHINGRIX; MAMMOGRAM & COLONOSCOPY REFERRALS SENT; UTD ON BDS   "

## 2024-09-29 NOTE — Patient Instructions (Addendum)
 [name],  Thank you for taking the time for your Medicare Wellness Visit. I appreciate your continued commitment to your health goals. Please review the care plan we discussed, and feel free to reach out if I can assist you further.  Please note that Annual Wellness Visits do not include a physical exam. Some assessments may be limited, especially if the visit was conducted virtually. If needed, we may recommend an in-person follow-up with your provider.  Ongoing Care Seeing your primary care provider every 3 to 6 months helps us  monitor your health and provide consistent, personalized care. APPT ON 10/24/24 @ 8:20 AM FOR A PHYSICAL W/ DR.ANDREWS  Referrals If a referral was made during today's visit and you haven't received any updates within two weeks, please contact the referred provider directly to check on the status.  REFERRALS SENT FOR MAMMOGRAM & COLONOSCOPY You have an order for:  []   2D Mammogram  [x]   3D Mammogram  []   Bone Density     Please call for appointment:  [facility]  [address] [place] [address] [phone] [facility] [address] [place], [place] [address] [phone] [facility] [address] [place], [place] [address] [phone]   Make sure to wear two-piece clothing.  No lotions, powders, or deodorants the day of the appointment. Make sure to bring picture ID and insurance card.  Bring list of medications you are currently taking including any supplements.   Schedule your [place] screening mammogram through MyChart!   Log into your MyChart account.  Go to 'Visit' (or 'Appointments' if on mobile App) --> Schedule an Appointment  Under 'Select a Reason for Visit' choose the Mammogram Screening option.  Complete the pre-visit questions and select the time and place that best fits your schedule.    Recommended Screenings:  Health Maintenance  Topic Date Due   Zoster (Shingles) Vaccine (1 of 2) Never done   DTaP/Tdap/Td vaccine (2 - Td or Tdap) 09/22/2023   Breast Cancer Screening  03/16/2024   Flu Shot  04/21/2024   COVID-19 Vaccine (7 - 2025-26 season) 05/22/2024   Colon Cancer Screening  12/19/2024   Medicare Annual Wellness Visit  09/29/2025   Osteoporosis screening with Bone Density Scan  01/27/2027   Pneumococcal Vaccine for age over 38  Completed   Hepatitis C Screening  Completed   Meningitis B Vaccine  Aged Out    Vision: Annual vision screenings are recommended for early detection of glaucoma, cataracts, and diabetic retinopathy. These exams can also reveal signs of chronic conditions such as diabetes and high blood pressure.  Dental: Annual dental screenings help detect early signs of oral cancer, gum disease, and other conditions linked to overall health, including heart disease and diabetes.  Please see the attached documents for additional preventive care recommendations.   NEXT AWV  10/04/25 @ 3:00 PM IN PERSON

## 2024-10-09 ENCOUNTER — Other Ambulatory Visit: Payer: Self-pay

## 2024-10-12 ENCOUNTER — Telehealth: Payer: Self-pay

## 2024-10-12 NOTE — Telephone Encounter (Signed)
 Pt call to schedule procedure.

## 2024-10-13 ENCOUNTER — Telehealth: Payer: Self-pay

## 2024-10-13 ENCOUNTER — Other Ambulatory Visit: Payer: Self-pay

## 2024-10-13 DIAGNOSIS — Z860100 Personal history of colon polyps, unspecified: Secondary | ICD-10-CM

## 2024-10-13 DIAGNOSIS — Z800 Family history of malignant neoplasm of digestive organs: Secondary | ICD-10-CM

## 2024-10-13 MED ORDER — NA SULFATE-K SULFATE-MG SULF 17.5-3.13-1.6 GM/177ML PO SOLN: 1.0000 | Freq: Once | ORAL | 0 refills | Status: AC

## 2024-10-13 NOTE — Telephone Encounter (Signed)
 Gastroenterology Pre-Procedure Review  Request Date: 12/27/24 Requesting Physician: Dr. Theophilus  PATIENT REVIEW QUESTIONS: The patient responded to the following health history questions as indicated:    1. Are you having any GI issues? no 2. Do you have a personal history of Polyps? yes (last colonoscopy performed by Dr. Unk 12/19/21 precancerous polyps recommended repeat in 3 years) 3. Do you have a family history of Colon Cancer or Polyps? yes (colon cancer brother and paternal aunt, precancerous colon polyps mother, father and nephew) 4. Diabetes Mellitus? no 5. Joint replacements in the past 12 months?no 6. Major health problems in the past 3 months?no 7. Any artificial heart valves, MVP, or defibrillator?no    MEDICATIONS & ALLERGIES:    Patient reports the following regarding taking any anticoagulation/antiplatelet therapy:   Plavix, Coumadin, Eliquis, Xarelto, Lovenox, Pradaxa, Brilinta, or Effient? no Aspirin? no  Patient confirms/reports the following medications:  Current Outpatient Medications  Medication Sig Dispense Refill   acetaminophen (TYLENOL) 500 MG tablet Take 500 mg by mouth every 6 (six) hours as needed.     acyclovir  (ZOVIRAX ) 400 MG tablet TAKE 1 TABLET BY MOUTH 3 TIMES DAILY FOR 7-10 DAYS AT THE ONSET OF SYMPTOMS. 270 tablet 0   acyclovir  ointment (ZOVIRAX ) 5 % Apply 1 Application topically as needed. (Patient not taking: Reported on 09/29/2024) 30 g 1   Ashwagandha 500 MG CAPS Take by mouth. (Patient not taking: Reported on 09/29/2024)     brimonidine (ALPHAGAN) 0.2 % ophthalmic solution Place 1 drop into both eyes 2 (two) times daily.     BRIMONIDINE TARTRATE-TIMOLOL OP Apply to eye.     CALCIUM 600 1500 (600 Ca) MG TABS tablet      carboxymethylcellulose (REFRESH PLUS) 0.5 % SOLN 1 drop 3 (three) times daily as needed.     cetirizine (ZYRTEC) 10 MG tablet      Cinnamon 500 MG capsule Take 500 mg by mouth daily.     diphenhydrAMINE (BENADRYL) 50 MG capsule  Take 50 mg by mouth every 6 (six) hours as needed.     erythromycin ophthalmic ointment 1 Application at bedtime. (Patient taking differently: 1 Application at bedtime. TAKES PRN)     ibuprofen (ADVIL) 600 MG tablet Take 600 mg by mouth every 6 (six) hours as needed. (Patient not taking: Reported on 09/29/2024)     latanoprost (XALATAN) 0.005 % ophthalmic solution      magnesium 30 MG tablet Take 30 mg by mouth 2 (two) times daily. (Patient not taking: Reported on 09/29/2024)     Melatonin 3 MG CAPS as needed.     Multiple Vitamin (MULTIVITAMIN) tablet Take 1 tablet by mouth daily.     NAPROXEN SODIUM PO Take by mouth.     Omega-3 Fatty Acids (FISH OIL) 1200 MG CAPS Take by mouth.     pimecrolimus  (ELIDEL ) 1 % cream Apply topically as needed. 30 g 0   Probiotic Product (PROBIOTIC-10 PO) Take by mouth every other day. (Patient taking differently: Take by mouth every other day. TAKES PRN)     vitamin D3 (CHOLECALCIFEROL) 25 MCG tablet Take 1,000 Units by mouth daily.     No current facility-administered medications for this visit.    Patient confirms/reports the following allergies:  Allergies[1]  No orders of the defined types were placed in this encounter.   AUTHORIZATION INFORMATION Primary Insurance: 1D#: Group #:  Secondary Insurance: 1D#: Group #:  SCHEDULE INFORMATION: Date: 12/27/24 Time: Location: [facility]    [1]  Allergies Allergen Reactions  Bee Pollen    Codeine    Grass Pollen(K-O-R-T-Swt Vern)    Latex    Molds & Smuts    Nickel    Proparacaine    Statins     Rhabomyolitis    Sulfa Antibiotics

## 2024-10-20 ENCOUNTER — Ambulatory Visit
Admission: RE | Admit: 2024-10-20 | Discharge: 2024-10-20 | Disposition: A | Source: Ambulatory Visit | Attending: Internal Medicine

## 2024-10-20 DIAGNOSIS — Z1231 Encounter for screening mammogram for malignant neoplasm of breast: Secondary | ICD-10-CM | POA: Insufficient documentation

## 2024-10-24 ENCOUNTER — Encounter: Admitting: Internal Medicine

## 2024-10-25 ENCOUNTER — Ambulatory Visit: Payer: Self-pay | Admitting: Internal Medicine

## 2024-12-27 ENCOUNTER — Ambulatory Visit: Admit: 2024-12-27

## 2025-10-04 ENCOUNTER — Ambulatory Visit
